# Patient Record
Sex: Male | Born: 1965 | Hispanic: No | Smoking: Former smoker
Health system: Southern US, Community
[De-identification: ages and names within clinical notes are randomized; demographics above are authoritative.]

## PROBLEM LIST (undated history)

## (undated) DIAGNOSIS — F419 Anxiety disorder, unspecified: Secondary | ICD-10-CM

## (undated) HISTORY — PX: OTHER SURGICAL HISTORY: SHX169

---

## 2012-07-10 ENCOUNTER — Encounter (HOSPITAL_COMMUNITY): Payer: Self-pay | Admitting: *Deleted

## 2012-07-10 ENCOUNTER — Emergency Department (INDEPENDENT_AMBULATORY_CARE_PROVIDER_SITE_OTHER)
Admission: EM | Admit: 2012-07-10 | Discharge: 2012-07-10 | Disposition: A | Discharge status: Home / Self Care | Payer: Self-pay | Source: Home / Self Care

## 2012-07-10 DIAGNOSIS — F419 Anxiety disorder, unspecified: Secondary | ICD-10-CM

## 2012-07-10 DIAGNOSIS — F411 Generalized anxiety disorder: Secondary | ICD-10-CM

## 2012-07-10 MED ORDER — LORAZEPAM 1 MG PO TABS: 1.0000 mg | ORAL_TABLET | Freq: Three times a day (TID) | ORAL | Status: DC
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
## 2015-03-20 ENCOUNTER — Emergency Department (HOSPITAL_COMMUNITY): Payer: 59

## 2015-03-20 ENCOUNTER — Emergency Department (HOSPITAL_COMMUNITY)
Admission: EM | Admit: 2015-03-20 | Discharge: 2015-03-20 | Disposition: A | Discharge status: Home / Self Care | Payer: 59 | Attending: Emergency Medicine | Admitting: Emergency Medicine

## 2015-03-20 ENCOUNTER — Encounter (HOSPITAL_COMMUNITY): Payer: Self-pay | Admitting: Emergency Medicine

## 2015-03-20 DIAGNOSIS — R197 Diarrhea, unspecified: Secondary | ICD-10-CM | POA: Insufficient documentation

## 2015-03-20 DIAGNOSIS — Z7982 Long term (current) use of aspirin: Secondary | ICD-10-CM | POA: Diagnosis not present

## 2015-03-20 DIAGNOSIS — R6883 Chills (without fever): Secondary | ICD-10-CM | POA: Diagnosis not present

## 2015-03-20 DIAGNOSIS — R1011 Right upper quadrant pain: Secondary | ICD-10-CM

## 2015-03-20 DIAGNOSIS — Z79899 Other long term (current) drug therapy: Secondary | ICD-10-CM | POA: Insufficient documentation

## 2015-03-20 DIAGNOSIS — K8050 Calculus of bile duct without cholangitis or cholecystitis without obstruction: Secondary | ICD-10-CM | POA: Diagnosis not present

## 2015-03-20 DIAGNOSIS — R630 Anorexia: Secondary | ICD-10-CM | POA: Insufficient documentation

## 2015-03-20 DIAGNOSIS — Z720 Tobacco use: Secondary | ICD-10-CM | POA: Insufficient documentation

## 2015-03-20 HISTORY — DX: Anxiety disorder, unspecified: F41.9

## 2015-03-20 LAB — COMPREHENSIVE METABOLIC PANEL
ALBUMIN: 4.4 g/dL (ref 3.5–5.0)
ALK PHOS: 61 U/L (ref 38–126)
ALT: 33 U/L (ref 17–63)
ANION GAP: 12 (ref 5–15)
AST: 28 U/L (ref 15–41)
BILIRUBIN TOTAL: 0.6 mg/dL (ref 0.3–1.2)
BUN: 12 mg/dL (ref 6–20)
CALCIUM: 9.2 mg/dL (ref 8.9–10.3)
CO2: 22 mmol/L (ref 22–32)
CREATININE: 1.17 mg/dL (ref 0.61–1.24)
Chloride: 103 mmol/L (ref 101–111)
GFR calc Af Amer: >60 mL/min (ref >60)
GLUCOSE: 147 mg/dL — AB (ref 65–99)
Potassium: 3.9 mmol/L (ref 3.5–5.1)
SODIUM: 137 mmol/L (ref 135–145)
TOTAL PROTEIN: 7.9 g/dL (ref 6.5–8.1)

## 2015-03-20 LAB — CBC WITH DIFFERENTIAL/PLATELET
Basophils Absolute: 0 10*3/uL (ref 0.0–0.1)
Basophils Relative: 0 % (ref 0–1)
Eosinophils Absolute: 0 10*3/uL (ref 0.0–0.7)
Eosinophils Relative: 0 % (ref 0–5)
HEMATOCRIT: 42.4 % (ref 39.0–52.0)
Hemoglobin: 14.7 g/dL (ref 13.0–17.0)
LYMPHS ABS: 1.6 10*3/uL (ref 0.7–4.0)
LYMPHS PCT: 10 % — AB (ref 12–46)
MCH: 31.5 pg (ref 26.0–34.0)
MCHC: 34.7 g/dL (ref 30.0–36.0)
MCV: 90.8 fL (ref 78.0–100.0)
MONOS PCT: 6 % (ref 3–12)
Monocytes Absolute: 1 10*3/uL (ref 0.1–1.0)
Neutro Abs: 13.8 10*3/uL — ABNORMAL HIGH (ref 1.7–7.7)
Neutrophils Relative %: 84 % — ABNORMAL HIGH (ref 43–77)
PLATELETS: 246 10*3/uL (ref 150–400)
RBC: 4.67 MIL/uL (ref 4.22–5.81)
RDW: 13.2 % (ref 11.5–15.5)
WBC: 16.4 10*3/uL — ABNORMAL HIGH (ref 4.0–10.5)

## 2015-03-20 LAB — URINALYSIS, ROUTINE W REFLEX MICROSCOPIC
BILIRUBIN URINE: NEGATIVE
GLUCOSE, UA: NEGATIVE mg/dL
Hgb urine dipstick: NEGATIVE
Ketones, ur: 15 mg/dL — AB
Nitrite: NEGATIVE
Protein, ur: NEGATIVE mg/dL
Specific Gravity, Urine: 1.03 (ref 1.005–1.030)
Urobilinogen, UA: 0.2 mg/dL (ref 0.0–1.0)
pH: 6.5 (ref 5.0–8.0)

## 2015-03-20 LAB — URINE MICROSCOPIC-ADD ON

## 2015-03-20 LAB — LIPASE, BLOOD: LIPASE: 20 U/L — AB (ref 22–51)

## 2015-03-20 MED ORDER — OXYCODONE-ACETAMINOPHEN 5-325 MG PO TABS: 1.0000 | ORAL_TABLET | Freq: Four times a day (QID) | ORAL | Status: DC | PRN

## 2015-03-20 MED ORDER — ONDANSETRON HCL 4 MG/2ML IJ SOLN
4.0000 mg | Freq: Once | INTRAMUSCULAR | Status: AC
  Administered 2015-03-20: 4 mg via INTRAVENOUS
  Filled 2015-03-20: qty 2

## 2015-03-20 MED ORDER — GI COCKTAIL ~~LOC~~
30.0000 mL | Freq: Once | ORAL | Status: AC
  Administered 2015-03-20: 30 mL via ORAL
  Filled 2015-03-20: qty 30

## 2015-03-20 MED ORDER — FENTANYL CITRATE (PF) 100 MCG/2ML IJ SOLN
50.0000 ug | Freq: Once | INTRAMUSCULAR | Status: AC
  Administered 2015-03-20: 50 ug via INTRAVENOUS
  Filled 2015-03-20: qty 2

## 2015-03-20 MED ORDER — ONDANSETRON HCL 4 MG PO TABS: 4.0000 mg | ORAL_TABLET | Freq: Four times a day (QID) | ORAL | Status: DC | PRN

## 2015-03-20 MED ORDER — OXYCODONE-ACETAMINOPHEN 5-325 MG PO TABS
2.0000 | ORAL_TABLET | Freq: Once | ORAL | Status: AC
  Administered 2015-03-20: 2 via ORAL
  Filled 2015-03-20: qty 2

## 2015-03-20 NOTE — ED Provider Notes (Signed)
CSN: [id number]     Arrival date & time 03/20/15  0534 History   First MD Initiated Contact with Patient 03/20/15 0700     Chief Complaint  Patient presents with  . Abdominal Pain     (Consider location/radiation/quality/duration/timing/severity/associated sxs/prior Treatment) Patient is a 49 y.o. male presenting with abdominal pain. The history is provided by the patient. No language interpreter was used.  Abdominal Pain Pain location:  Epigastric and RUQ Pain quality: aching and sharp   Pain radiates to:  R flank Pain severity:  Severe Onset quality:  Gradual Duration:  2 days Timing:  Constant Progression:  Worsening Chronicity:  New Relieved by:  Nothing Worsened by:  Eating Ineffective treatments:  None tried Associated symptoms: anorexia, chills, diarrhea, nausea and vomiting   Associated symptoms: no chest pain, no constipation, no cough, no dysuria, no fatigue, no fever and no shortness of breath     Past Medical History  Diagnosis Date  . Anxiety    Past Surgical History  Procedure Laterality Date  . Torn meniscus     Family History  Problem Relation Age of Onset  . Family history unknown: Yes   History  Substance Use Topics  . Smoking status: Current Every Day Smoker -- 0.00 packs/day    Types: Cigarettes  . Smokeless tobacco: Not on file  . Alcohol Use: Yes     Comment: social    Review of Systems  Constitutional: Positive for chills. Negative for fever, activity change, appetite change and fatigue.  HENT: Negative for congestion, facial swelling, rhinorrhea and trouble swallowing.   Eyes: Negative for photophobia and pain.  Respiratory: Negative for cough, chest tightness and shortness of breath.   Cardiovascular: Negative for chest pain and leg swelling.  Gastrointestinal: Positive for nausea, vomiting, abdominal pain, diarrhea and anorexia. Negative for constipation.  Endocrine: Negative for polydipsia and polyuria.  Genitourinary: Negative for  dysuria, urgency, decreased urine volume and difficulty urinating.  Musculoskeletal: Negative for back pain and gait problem.  Skin: Negative for color change, rash and wound.  Allergic/Immunologic: Negative for immunocompromised state.  Neurological: Negative for dizziness, facial asymmetry, speech difficulty, weakness, numbness and headaches.  Psychiatric/Behavioral: Negative for confusion, decreased concentration and agitation.      Allergies  Review of patient's allergies indicates no known allergies.  Home Medications   Prior to Admission medications   Medication Sig Start Date End Date Taking? Authorizing Provider  Aspirin-Acetaminophen (GOODYS BODY PAIN PO) Take 2 packets by mouth 3 (three) times daily as needed.   Yes Historical Provider, MD  IBUPROFEN PO Take 800 mg by mouth 3 (three) times daily as needed (as needed for headache or pain).   Yes Historical Provider, MD  Multiple Vitamins-Minerals (MULTIVITAMIN ADULT PO) Take 1 packet by mouth daily.   Yes Historical Provider, MD  LORazepam (ATIVAN) 1 MG tablet Take 1 tablet (1 mg total) by mouth every 8 (eight) hours. 07/10/12   Srikar Cherlynn Kaiser, MD  ondansetron (ZOFRAN) 4 MG tablet Take 1 tablet (4 mg total) by mouth every 6 (six) hours as needed for nausea or vomiting. 03/20/15   Toy Cookey, MD  oxyCODONE-acetaminophen (PERCOCET) 5-325 MG per tablet Take 1-2 tablets by mouth every 6 (six) hours as needed. 03/20/15   Toy Cookey, MD   BP 124/76 mmHg  Pulse 64  Temp(Src) 98.3 F (36.8 C) (Oral)  Resp 18  SpO2 95% Physical Exam  Constitutional: He is oriented to person, place, and time. He appears well-developed and well-nourished. No  distress.  HENT:  Head: Normocephalic and atraumatic.  Mouth/Throat: No oropharyngeal exudate.  Eyes: Pupils are equal, round, and reactive to light.  Neck: Normal range of motion. Neck supple.  Cardiovascular: Normal rate, regular rhythm and normal heart sounds.  Exam reveals no gallop and  no friction rub.   No murmur heard. Pulmonary/Chest: Effort normal and breath sounds normal. No respiratory distress. He has no wheezes. He has no rales.  Abdominal: Soft. Bowel sounds are normal. He exhibits no distension and no mass. There is tenderness in the right upper quadrant and epigastric area. There is no rigidity, no rebound, no guarding and negative Murphy's sign.  Musculoskeletal: Normal range of motion. He exhibits no edema or tenderness.  Neurological: He is alert and oriented to person, place, and time.  Skin: Skin is warm and dry.  Psychiatric: He has a normal mood and affect.    ED Course  Procedures (including critical care time) Labs Review Labs Reviewed  COMPREHENSIVE METABOLIC PANEL - Abnormal; Notable for the following:    Glucose, Bld 147 (*)    All other components within normal limits  LIPASE, BLOOD - Abnormal; Notable for the following:    Lipase 20 (*)    All other components within normal limits  CBC WITH DIFFERENTIAL/PLATELET - Abnormal; Notable for the following:    WBC 16.4 (*)    Neutrophils Relative % 84 (*)    Neutro Abs 13.8 (*)    Lymphocytes Relative 10 (*)    All other components within normal limits  URINALYSIS, ROUTINE W REFLEX MICROSCOPIC (NOT AT [facility]) - Abnormal; Notable for the following:    Color, Urine AMBER (*)    Ketones, ur 15 (*)    Leukocytes, UA TRACE (*)    All other components within normal limits  URINE MICROSCOPIC-ADD ON    Imaging Review US Abdomen Limited  03/20/2015   CLINICAL DATA:  One day history of right upper quadrant pain with nausea and vomiting  EXAM: US ABDOMEN LIMITED - RIGHT UPPER QUADRANT  COMPARISON:  None.  FINDINGS: Gallbladder:  Within the gallbladder, there is a 1.0 cm echogenic focus which shadows but does not move. There is no gallbladder wall thickening or pericholecystic fluid. No sonographic Murphy sign noted.  Common bile duct:  Diameter: 3 mm. No intrahepatic or extrahepatic biliary duct dilatation.   Liver:  No focal lesion identified. Within normal limits in parenchymal echogenicity.  IMPRESSION: 1 cm echogenic focus which shadows but does not move. Suspect adherent gallstone given the shadowing. Study otherwise unremarkable.   Electronically Signed   By: Bretta Bang III M.D.   On: 03/20/2015 09:56     EKG Interpretation None      MDM   Final diagnoses:  RUQ pain  Biliary colic    Pt is a 49 y.o. male with Pmhx as above who presents with 2 days of gradual onset worsening epigastric, RUQ pain, with assoc anorexia, n/v, chills. Pain worse after eating fried fish. No fever. + RUQ pain w/o rebound or guarding. CBC with leukocytosis, CMP, lipase nml. RUQ [place] ordered, was positve for single nonmobile stone w/o signs of cholecystitis. Pain improved with narcotic treatment, was tolering fluids. Will d/c home with percocet, zofran, dietary changes and instructions for outpatient follow up with gen surgery. Pt & wife have asked if they could just have GB taken out today, I do not feel there are indications for such as symptoms controlled and no signs of cholecystitis. Marland Kitchen  Earnstine Regalodney Casciano evaluation in the Emergency Department is complete. It has been determined that no acute conditions requiring further emergency intervention are present at this time. The patient/guardian have been advised of the diagnosis and plan. We have discussed signs and symptoms that warrant return to the ED, such as changes or worsening in symptoms, worsening or unresolved pain w/ treatment, fever, inability to tolerate liquids.       Toy CookeyMegan Docherty, MD 03/20/15 1135

## 2015-03-20 NOTE — Discharge Instructions (Signed)
Biliary Colic  °Biliary colic is a steady or irregular pain in the upper abdomen. It is usually under the right side of the rib cage. It happens when gallstones interfere with the normal flow of bile from the gallbladder. Bile is a liquid that helps to digest fats. Bile is made in the liver and stored in the gallbladder. When you eat a meal, bile passes from the gallbladder through the cystic duct and the common bile duct into the small intestine. There, it mixes with partially digested food. If a gallstone blocks either of these ducts, the normal flow of bile is blocked. The muscle cells in the bile duct contract forcefully to try to move the stone. This causes the pain of biliary colic.  °SYMPTOMS  °· A person with biliary colic usually complains of pain in the upper abdomen. This pain can be: °¨ In the center of the upper abdomen just below the breastbone. °¨ In the upper-right part of the abdomen, near the gallbladder and liver. °¨ Spread back toward the right shoulder blade. °· Nausea and vomiting. °· The pain usually occurs after eating. °· Biliary colic is usually triggered by the digestive system's demand for bile. The demand for bile is high after fatty meals. Symptoms can also occur when a person who has been fasting suddenly eats a very large meal. Most episodes of biliary colic pass after 1 to 5 hours. After the most intense pain passes, your abdomen may continue to ache mildly for about 24 hours. °DIAGNOSIS  °After you describe your symptoms, your caregiver will perform a physical exam. He or she will pay attention to the upper right portion of your belly (abdomen). This is the area of your liver and gallbladder. An ultrasound will help your caregiver look for gallstones. Specialized scans of the gallbladder may also be done. Blood tests may be done, especially if you have fever or if your pain persists. °PREVENTION  °Biliary colic can be prevented by controlling the risk factors for gallstones. Some of  these risk factors, such as heredity, increasing age, and pregnancy are a normal part of life. Obesity and a high-fat diet are risk factors you can change through a healthy lifestyle. Women going through menopause who take hormone replacement therapy (estrogen) are also more likely to develop biliary colic. °TREATMENT  °· Pain medication may be prescribed. °· You may be encouraged to eat a fat-free diet. °· If the first episode of biliary colic is severe, or episodes of colic keep retuning, surgery to remove the gallbladder (cholecystectomy) is usually recommended. This procedure can be done through small incisions using an instrument called a laparoscope. The procedure often requires a brief stay in the hospital. Some people can leave the hospital the same day. It is the most widely used treatment in people troubled by painful gallstones. It is effective and safe, with no complications in more than 90% of cases. °· If surgery cannot be done, medication that dissolves gallstones may be used. This medication is expensive and can take months or years to work. Only small stones will dissolve. °· Rarely, medication to dissolve gallstones is combined with a procedure called shock-wave lithotripsy. This procedure uses carefully aimed shock waves to break up gallstones. In many people treated with this procedure, gallstones form again within a few years. °PROGNOSIS  °If gallstones block your cystic duct or common bile duct, you are at risk for repeated episodes of biliary colic. There is also a 25% chance that you will develop   a gallbladder infection(acute cholecystitis), or some other complication of gallstones within 10 to 20 years. If you have surgery, schedule it at a time that is convenient for you and at a time when you are not sick. °HOME CARE INSTRUCTIONS  °· Drink plenty of clear fluids. °· Avoid fatty, greasy or fried foods, or any foods that make your pain worse. °· Take medications as directed. °SEEK MEDICAL  CARE IF:  °· You develop a fever over 100.5° F (38.1° C). °· Your pain gets worse over time. °· You develop nausea that prevents you from eating and drinking. °· You develop vomiting. °SEEK IMMEDIATE MEDICAL CARE IF:  °· You have continuous or severe belly (abdominal) pain which is not relieved with medications. °· You develop nausea and vomiting which is not relieved with medications. °· You have symptoms of biliary colic and you suddenly develop a fever and shaking chills. This may signal cholecystitis. Call your caregiver immediately. °· You develop a yellow color to your skin or the white part of your eyes (jaundice). °Document Released: 03/06/2006 Document Revised: 12/26/2011 Document Reviewed: 05/15/2008 °ExitCare® Patient Information ©2015 [employer]. This information is not intended to replace advice given to you by your health care provider. Make sure you discuss any questions you have with your health care provider. ° °

## 2015-03-20 NOTE — ED Notes (Signed)
Pt c/o headache -- worse than abd pain. abd pain is 5/10-

## 2015-03-20 NOTE — ED Notes (Signed)
Pt to US via stretcher

## 2015-03-20 NOTE — ED Notes (Signed)
Pt is in stable condition upon d/c and ambulates from ED. 

## 2015-03-20 NOTE — ED Notes (Signed)
Pt. reports mid/RUQ pain onset yesterday with emesis and diarrhea. , denies fever or chills. No urinary discomfort.

## 2015-03-30 ENCOUNTER — Other Ambulatory Visit: Payer: Self-pay | Admitting: General Surgery

## 2015-04-03 ENCOUNTER — Inpatient Hospital Stay (HOSPITAL_COMMUNITY)
Admission: EM | Admit: 2015-04-03 | Discharge: 2015-04-05 | DRG: 419 | Disposition: A | Discharge status: Home / Self Care | Payer: 59 | Attending: General Surgery | Admitting: General Surgery

## 2015-04-03 ENCOUNTER — Emergency Department (HOSPITAL_COMMUNITY): Payer: 59

## 2015-04-03 DIAGNOSIS — R1011 Right upper quadrant pain: Secondary | ICD-10-CM | POA: Diagnosis present

## 2015-04-03 DIAGNOSIS — K8020 Calculus of gallbladder without cholecystitis without obstruction: Secondary | ICD-10-CM

## 2015-04-03 DIAGNOSIS — K819 Cholecystitis, unspecified: Secondary | ICD-10-CM

## 2015-04-03 DIAGNOSIS — K8010 Calculus of gallbladder with chronic cholecystitis without obstruction: Secondary | ICD-10-CM | POA: Diagnosis present

## 2015-04-03 DIAGNOSIS — R52 Pain, unspecified: Secondary | ICD-10-CM

## 2015-04-03 DIAGNOSIS — K8000 Calculus of gallbladder with acute cholecystitis without obstruction: Secondary | ICD-10-CM | POA: Diagnosis present

## 2015-04-03 DIAGNOSIS — F17210 Nicotine dependence, cigarettes, uncomplicated: Secondary | ICD-10-CM | POA: Diagnosis present

## 2015-04-03 LAB — COMPREHENSIVE METABOLIC PANEL
ALK PHOS: 56 U/L (ref 38–126)
ALT: 26 U/L (ref 17–63)
AST: 23 U/L (ref 15–41)
Albumin: 3.6 g/dL (ref 3.5–5.0)
Anion gap: 7 (ref 5–15)
BILIRUBIN TOTAL: 0.7 mg/dL (ref 0.3–1.2)
BUN: 13 mg/dL (ref 6–20)
CHLORIDE: 106 mmol/L (ref 101–111)
CO2: 25 mmol/L (ref 22–32)
CREATININE: 0.99 mg/dL (ref 0.61–1.24)
Calcium: 9.1 mg/dL (ref 8.9–10.3)
GFR calc Af Amer: >60 mL/min (ref >60)
GFR calc non Af Amer: >60 mL/min (ref >60)
Glucose, Bld: 100 mg/dL — ABNORMAL HIGH (ref 65–99)
Potassium: 3.7 mmol/L (ref 3.5–5.1)
Sodium: 138 mmol/L (ref 135–145)
Total Protein: 6.4 g/dL — ABNORMAL LOW (ref 6.5–8.1)

## 2015-04-03 LAB — CBC
HCT: 40.1 % (ref 39.0–52.0)
HEMOGLOBIN: 13.6 g/dL (ref 13.0–17.0)
MCH: 31.5 pg (ref 26.0–34.0)
MCHC: 33.9 g/dL (ref 30.0–36.0)
MCV: 92.8 fL (ref 78.0–100.0)
Platelets: 248 10*3/uL (ref 150–400)
RBC: 4.32 MIL/uL (ref 4.22–5.81)
RDW: 12.9 % (ref 11.5–15.5)
WBC: 7 10*3/uL (ref 4.0–10.5)

## 2015-04-03 LAB — CBC WITH DIFFERENTIAL/PLATELET
BASOS ABS: 0 10*3/uL (ref 0.0–0.1)
Basophils Relative: 1 % (ref 0–1)
Eosinophils Absolute: 0.3 10*3/uL (ref 0.0–0.7)
Eosinophils Relative: 5 % (ref 0–5)
HEMATOCRIT: 42.2 % (ref 39.0–52.0)
Hemoglobin: 14.5 g/dL (ref 13.0–17.0)
LYMPHS PCT: 46 % (ref 12–46)
Lymphs Abs: 3.3 10*3/uL (ref 0.7–4.0)
MCH: 31.6 pg (ref 26.0–34.0)
MCHC: 34.4 g/dL (ref 30.0–36.0)
MCV: 91.9 fL (ref 78.0–100.0)
Monocytes Absolute: 0.7 10*3/uL (ref 0.1–1.0)
Monocytes Relative: 9 % (ref 3–12)
NEUTROS ABS: 2.7 10*3/uL (ref 1.7–7.7)
NEUTROS PCT: 39 % — AB (ref 43–77)
Platelets: 263 10*3/uL (ref 150–400)
RBC: 4.59 MIL/uL (ref 4.22–5.81)
RDW: 13 % (ref 11.5–15.5)
WBC: 7 10*3/uL (ref 4.0–10.5)

## 2015-04-03 LAB — CREATININE, SERUM
Creatinine, Ser: 1.07 mg/dL (ref 0.61–1.24)
GFR calc Af Amer: >60 mL/min (ref >60)
GFR calc non Af Amer: >60 mL/min (ref >60)

## 2015-04-03 LAB — LIPASE, BLOOD: Lipase: 26 U/L (ref 22–51)

## 2015-04-03 MED ORDER — ONDANSETRON HCL 4 MG/2ML IJ SOLN: 4.0000 mg | Freq: Four times a day (QID) | INTRAMUSCULAR | Status: DC | PRN

## 2015-04-03 MED ORDER — PANTOPRAZOLE SODIUM 40 MG IV SOLR
40.0000 mg | Freq: Every day | INTRAVENOUS | Status: DC
  Administered 2015-04-03 – 2015-04-04 (×2): 40 mg via INTRAVENOUS
  Filled 2015-04-03 (×2): qty 40

## 2015-04-03 MED ORDER — SODIUM CHLORIDE 0.9 % IV BOLUS (SEPSIS)
1000.0000 mL | Freq: Once | INTRAVENOUS | Status: AC
  Administered 2015-04-03: 1000 mL via INTRAVENOUS

## 2015-04-03 MED ORDER — DEXTROSE 5 % IV SOLN
2.0000 g | INTRAVENOUS | Status: DC
  Administered 2015-04-03 – 2015-04-04 (×2): 2 g via INTRAVENOUS
  Filled 2015-04-03 (×4): qty 2

## 2015-04-03 MED ORDER — HYDROMORPHONE HCL 1 MG/ML IJ SOLN
1.0000 mg | Freq: Once | INTRAMUSCULAR | Status: AC
  Administered 2015-04-03: 1 mg via INTRAVENOUS
  Filled 2015-04-03: qty 1

## 2015-04-03 MED ORDER — POTASSIUM CHLORIDE IN NACL 20-0.9 MEQ/L-% IV SOLN
INTRAVENOUS | Status: DC
  Administered 2015-04-03 – 2015-04-05 (×4): via INTRAVENOUS
  Filled 2015-04-03 (×6): qty 1000

## 2015-04-03 MED ORDER — ONDANSETRON HCL 4 MG/2ML IJ SOLN
4.0000 mg | Freq: Once | INTRAMUSCULAR | Status: AC
  Administered 2015-04-03: 4 mg via INTRAVENOUS
  Filled 2015-04-03: qty 2

## 2015-04-03 MED ORDER — HYDROMORPHONE HCL 1 MG/ML IJ SOLN
0.5000 mg | INTRAMUSCULAR | Status: DC | PRN
  Administered 2015-04-03 – 2015-04-05 (×14): 1 mg via INTRAVENOUS
  Filled 2015-04-03 (×14): qty 1

## 2015-04-03 MED ORDER — DIPHENHYDRAMINE HCL 50 MG/ML IJ SOLN
12.5000 mg | Freq: Four times a day (QID) | INTRAMUSCULAR | Status: DC | PRN
  Administered 2015-04-04: 12.5 mg via INTRAVENOUS
  Filled 2015-04-03 (×2): qty 1

## 2015-04-03 MED ORDER — HYDROMORPHONE HCL 1 MG/ML IJ SOLN
0.5000 mg | Freq: Once | INTRAMUSCULAR | Status: AC
  Administered 2015-04-03: 0.5 mg via INTRAVENOUS
  Filled 2015-04-03: qty 1

## 2015-04-03 MED ORDER — DIPHENHYDRAMINE HCL 12.5 MG/5ML PO ELIX: 12.5000 mg | ORAL_SOLUTION | Freq: Four times a day (QID) | ORAL | Status: DC | PRN

## 2015-04-03 MED ORDER — HEPARIN SODIUM (PORCINE) 5000 UNIT/ML IJ SOLN
5000.0000 [IU] | Freq: Three times a day (TID) | INTRAMUSCULAR | Status: AC
  Administered 2015-04-03 (×2): 5000 [IU] via SUBCUTANEOUS
  Filled 2015-04-03 (×2): qty 1

## 2015-04-03 NOTE — H&P (Signed)
Jacob Sheppard is an 49 y.o. male.   Chief Complaint: abdominal pain HPI: Seen by Dr. Marlou Starks and scheduled for elective cholecystectomy on 6/27.  Pt having pain with PO's for a month, some diarrhea, headaches worse with pain. Pain usually doesn't last very long.  Last PM he started having pain about 10 PM, he works nights at a [employer] facility.  He came home early this AM, this is the worst the pain has ever been.  It has not gotten better, he is having nausea, vomited x 5 this AM. He tried an egg earlier and then vomited it up   GB, now with ongoing pain.  [place] on 03/20/15 showed:  Within the gallbladder, there is a 1.0 cm echogenic focus which shadows but does not move. There is no gallbladder wall thickening or pericholecystic fluid. No sonographic Murphy sign noted. We are ask to see he has pain that is only improved with pain meds.  Past Medical History  Diagnosis Date  . Anxiety Hx of tobacco use Hx of ETOH use for about 9 years     Past Surgical History  Procedure Laterality Date  . Torn meniscus      Family History  Problem Relation Age of Onset  . Family history unknown: Yes   Social History:  reports that he has been smoking Cigarettes.  He has been smoking about 0.00 packs per day. He does not have any smokeless tobacco history on file. He reports that he drinks alcohol. He reports that he does not use illicit drugs.    Tobacco 38 years ETOH heavy for 9 years, social last year Drugs: none   Allergies: No Known Allergies  Prior to Admission medications   Medication Sig Start Date End Date Taking? Authorizing Provider  IBUPROFEN PO Take 800 mg by mouth 3 (three) times daily as needed (as needed for headache or pain).   Yes Historical Provider, MD  Multiple Vitamins-Minerals (MULTIVITAMIN ADULT PO) Take 1 packet by mouth daily.   Yes Historical Provider, MD  ondansetron (ZOFRAN) 4 MG tablet Take 1 tablet (4 mg total) by mouth every 6 (six) hours as needed for nausea or  vomiting. 03/20/15  Yes Ernestina Patches, MD  LORazepam (ATIVAN) 1 MG tablet Take 1 tablet (1 mg total) by mouth every 8 (eight) hours. Patient not taking: Reported on 04/03/2015 07/10/12   Bynum Bellows, MD  oxyCODONE-acetaminophen (PERCOCET) 5-325 MG per tablet Take 1-2 tablets by mouth every 6 (six) hours as needed. Patient not taking: Reported on 04/03/2015 03/20/15   Ernestina Patches, MD     Results for orders placed or performed during the hospital encounter of 04/03/15 (from the past 48 hour(s))  CBC with Differential     Status: Abnormal   Collection Time: 04/03/15  9:50 AM  Result Value Ref Range   WBC 7.0 4.0 - 10.5 K/uL   RBC 4.59 4.22 - 5.81 MIL/uL   Hemoglobin 14.5 13.0 - 17.0 g/dL   HCT 42.2 39.0 - 52.0 %   MCV 91.9 78.0 - 100.0 fL   MCH 31.6 26.0 - 34.0 pg   MCHC 34.4 30.0 - 36.0 g/dL   RDW 13.0 11.5 - 15.5 %   Platelets 263 150 - 400 K/uL   Neutrophils Relative % 39 (L) 43 - 77 %   Neutro Abs 2.7 1.7 - 7.7 K/uL   Lymphocytes Relative 46 12 - 46 %   Lymphs Abs 3.3 0.7 - 4.0 K/uL   Monocytes Relative 9 3 -  12 %   Monocytes Absolute 0.7 0.1 - 1.0 K/uL   Eosinophils Relative 5 0 - 5 %   Eosinophils Absolute 0.3 0.0 - 0.7 K/uL   Basophils Relative 1 0 - 1 %   Basophils Absolute 0.0 0.0 - 0.1 K/uL  Comprehensive metabolic panel     Status: Abnormal   Collection Time: 04/03/15  9:50 AM  Result Value Ref Range   Sodium 138 135 - 145 mmol/L   Potassium 3.7 3.5 - 5.1 mmol/L   Chloride 106 101 - 111 mmol/L   CO2 25 22 - 32 mmol/L   Glucose, Bld 100 (H) 65 - 99 mg/dL   BUN 13 6 - 20 mg/dL   Creatinine, Ser 0.99 0.61 - 1.24 mg/dL   Calcium 9.1 8.9 - 10.3 mg/dL   Total Protein 6.4 (L) 6.5 - 8.1 g/dL   Albumin 3.6 3.5 - 5.0 g/dL   AST 23 15 - 41 U/L   ALT 26 17 - 63 U/L   Alkaline Phosphatase 56 38 - 126 U/L   Total Bilirubin 0.7 0.3 - 1.2 mg/dL   GFR calc non Af Amer >60 >60 mL/min   GFR calc Af Amer >60 >60 mL/min    Comment: (NOTE) The eGFR has been calculated using the  CKD EPI equation. This calculation has not been validated in all clinical situations. eGFR's persistently <60 mL/min signify possible Chronic Kidney Disease.    Anion gap 7 5 - 15  Lipase, blood     Status: None   Collection Time: 04/03/15  9:50 AM  Result Value Ref Range   Lipase 26 22 - 51 U/L   No results found.  Review of Systems  Constitutional: Positive for weight loss (2 pounds). Negative for fever.  Eyes: Positive for blurred vision.  Respiratory: Negative.   Cardiovascular: Negative.   Gastrointestinal: Positive for nausea, vomiting, abdominal pain and diarrhea.  Genitourinary: Negative.   Musculoskeletal: Negative.   Skin: Negative.   Neurological: Negative.   Endo/Heme/Allergies: Negative.   Psychiatric/Behavioral: Negative.     Blood pressure 130/74, pulse 57, temperature 97.5 F (36.4 C), temperature source Oral, resp. rate 18, SpO2 97 %. Physical Exam  Constitutional: He is oriented to person, place, and time. He appears well-developed and well-nourished. He appears distressed (he is having allot of pain right side).  HENT:  Head: Normocephalic and atraumatic.  Nose: Nose normal.  Eyes: Conjunctivae and EOM are normal. Right eye exhibits no discharge. Left eye exhibits no discharge. Scleral icterus is present.  Neck: Normal range of motion. Neck supple. No JVD present. No tracheal deviation present. No thyromegaly present.  Cardiovascular: Normal rate, regular rhythm, normal heart sounds and intact distal pulses.   No murmur heard. Respiratory: Effort normal and breath sounds normal. No respiratory distress. He has no wheezes. He has no rales. He exhibits no tenderness.  GI: Soft. Bowel sounds are normal. He exhibits no distension and no mass. There is tenderness (Right side, mostly RUQ). There is no rebound and no guarding.  Musculoskeletal: He exhibits no edema.  Lymphadenopathy:    He has no cervical adenopathy.  Neurological: He is alert and oriented to  person, place, and time. No cranial nerve deficit.  Skin: Skin is warm and dry. No rash noted. No erythema. No pallor.  Psychiatric: He has a normal mood and affect. His behavior is normal. Judgment and thought content normal.     Assessment/Plan Acute cholecystitis with cholelithiasis Hx tobacco use  Hx of ETOH  Plan:  Admit, hydrate, pain control tonight, surgery tomorrow.  Annalie Wenner 04/03/2015, 11:29 AM

## 2015-04-03 NOTE — ED Provider Notes (Signed)
CSN: [id number]     Arrival date & time 04/03/15  4098 History   First MD Initiated Contact with Patient 04/03/15 0945     Chief Complaint  Patient presents with  . Abdominal Pain     (Consider location/radiation/quality/duration/timing/severity/associated sxs/prior Treatment) HPI  Blood pressure 128/77, pulse 68, temperature 97.5 F (36.4 C), temperature source Oral, resp. rate 18, SpO2 99 %.  Jacob Sheppard is a 49 y.o. male complaining of right upper quadrant abdominal pain rated 10 out of 10 with no exacerbating or alleviating factors identified associated with multiple episodes of nonbloody, nonbilious, non-coffee ground emesis and single episode of loose stools starting yesterday at 10 PM. Patient has known  cholelithiasis and is scheduled for cystectomy in 2 weeks by Dr. Carolynne Edouard, patient's wife called [facility] and was advised to come to the ED for evaluation of possible expedited surgery. Patient reports associated 7 out of 10 generalized headache.   Past Medical History  Diagnosis Date  . Anxiety    Past Surgical History  Procedure Laterality Date  . Torn meniscus     Family History  Problem Relation Age of Onset  . Family history unknown: Yes   History  Substance Use Topics  . Smoking status: Current Every Day Smoker -- 0.00 packs/day    Types: Cigarettes  . Smokeless tobacco: Not on file  . Alcohol Use: Yes     Comment: social    Review of Systems  10 systems reviewed and found to be negative, except as noted in the HPI.   Allergies  Review of patient's allergies indicates no known allergies.  Home Medications   Prior to Admission medications   Medication Sig Start Date End Date Taking? Authorizing Provider  Aspirin-Acetaminophen (GOODYS BODY PAIN PO) Take 2 packets by mouth 3 (three) times daily as needed.    Historical Provider, MD  IBUPROFEN PO Take 800 mg by mouth 3 (three) times daily as needed (as needed for headache or pain).    Historical Provider, MD   LORazepam (ATIVAN) 1 MG tablet Take 1 tablet (1 mg total) by mouth every 8 (eight) hours. 07/10/12   Cristal Ford, MD  Multiple Vitamins-Minerals (MULTIVITAMIN ADULT PO) Take 1 packet by mouth daily.    Historical Provider, MD  ondansetron (ZOFRAN) 4 MG tablet Take 1 tablet (4 mg total) by mouth every 6 (six) hours as needed for nausea or vomiting. 03/20/15   Toy Cookey, MD  oxyCODONE-acetaminophen (PERCOCET) 5-325 MG per tablet Take 1-2 tablets by mouth every 6 (six) hours as needed. 03/20/15   Toy Cookey, MD   BP 128/77 mmHg  Pulse 68  Temp(Src) 97.5 F (36.4 C) (Oral)  Resp 18  SpO2 99% Physical Exam  Constitutional: He is oriented to person, place, and time. He appears well-developed and well-nourished. No distress.  HENT:  Head: Normocephalic and atraumatic.  Mouth/Throat: Oropharynx is clear and moist.  Eyes: Conjunctivae and EOM are normal. Pupils are equal, round, and reactive to light.  No TTP of maxillary or frontal sinuses  No TTP or induration of temporal arteries bilaterally  Neck: Normal range of motion. Neck supple.  FROM to C-spine. Pt can touch chin to chest without discomfort. No TTP of midline cervical spine.   Cardiovascular: Normal rate, regular rhythm and intact distal pulses.   Pulmonary/Chest: Effort normal and breath sounds normal. No stridor. No respiratory distress. He has no wheezes. He has no rales. He exhibits no tenderness.  Abdominal: Soft. Bowel sounds are normal. He exhibits  no distension and no mass. There is tenderness. There is no rebound and no guarding.  Tender in the right upper quadrant with no guarding or rebound, hyperactive bowel sounds.  Musculoskeletal: Normal range of motion. He exhibits no edema or tenderness.  Neurological: He is alert and oriented to person, place, and time. No cranial nerve deficit.  Follows commands, Clear, goal oriented speech, Strength is 5 out of 5x4 extremities, patient ambulates with a coordinated in  nonantalgic gait. Sensation is grossly intact.    Psychiatric: He has a normal mood and affect.  Nursing note and vitals reviewed.   ED Course  Procedures (including critical care time) Labs Review  US Abdomen Limited  03/20/2015   CLINICAL DATA:  One day history of right upper quadrant pain with nausea and vomiting  EXAM: US ABDOMEN LIMITED - RIGHT UPPER QUADRANT  COMPARISON:  None.  FINDINGS: Gallbladder:  Within the gallbladder, there is a 1.0 cm echogenic focus which shadows but does not move. There is no gallbladder wall thickening or pericholecystic fluid. No sonographic Murphy sign noted.  Common bile duct:  Diameter: 3 mm. No intrahepatic or extrahepatic biliary duct dilatation.  Liver:  No focal lesion identified. Within normal limits in parenchymal echogenicity.  IMPRESSION: 1 cm echogenic focus which shadows but does not move. Suspect adherent gallstone given the shadowing. Study otherwise unremarkable.   Electronically Signed   By: Bretta Bang III M.D.   On: 03/20/2015 09:56   US Abdomen Limited Ruq  04/03/2015   CLINICAL DATA:  Pain and anxiety.  Abdominal pain.  EXAM: US ABDOMEN LIMITED - RIGHT UPPER QUADRANT  COMPARISON:  03/20/2015  FINDINGS: Gallbladder:  No gallstones or wall thickening visualized. Suspected adherent sludge along the inferior margin of the gallbladder, ill-defined. No sonographic Murphy sign noted.  Common bile duct:  Diameter: 5 mm  Liver:  No focal lesion identified. Within normal limits in parenchymal echogenicity.  IMPRESSION: 1. Small amount of adherent sludge along the inferior portion of the gallbladder. No shadowing to favor gallstone. Otherwise negative.   Electronically Signed   By: Gaylyn Rong M.D.   On: 04/03/2015 13:28     EKG Interpretation None      MDM   Final diagnoses:  Pain    Filed Vitals:   04/03/15 1245 04/03/15 1315 04/03/15 1330 04/03/15 1416  BP: 114/70 111/74 115/76 109/73  Pulse: 56 50 55 56  Temp:    98.2 F  (36.8 C)  TempSrc:    Oral  Resp:    18  SpO2: 97% 97% 95% 98%    Medications  heparin injection 5,000 Units (5,000 Units Subcutaneous Given 04/03/15 1342)  0.9 % NaCl with KCl 20 mEq/ L  infusion (not administered)  cefTRIAXone (ROCEPHIN) 2 g in dextrose 5 % 50 mL IVPB (2 g Intravenous New Bag/Given 04/03/15 1342)  HYDROmorphone (DILAUDID) injection 0.5-1 mg (1 mg Intravenous Given 04/03/15 1342)  diphenhydrAMINE (BENADRYL) injection 12.5 mg (not administered)    Or  diphenhydrAMINE (BENADRYL) 12.5 MG/5ML elixir 12.5 mg (not administered)  ondansetron (ZOFRAN) injection 4 mg (not administered)  pantoprazole (PROTONIX) injection 40 mg (not administered)  sodium chloride 0.9 % bolus 1,000 mL (0 mLs Intravenous Stopped 04/03/15 1212)  HYDROmorphone (DILAUDID) injection 0.5 mg (0.5 mg Intravenous Given 04/03/15 1023)  ondansetron (ZOFRAN) injection 4 mg (4 mg Intravenous Given 04/03/15 1023)  HYDROmorphone (DILAUDID) injection 1 mg (1 mg Intravenous Given 04/03/15 1115)    Jacob Sheppard is a pleasant 49 y.o. male presenting  with right upper quadrant abdominal pain, nausea vomiting and loose stool onset last night at 10 PM. Abdominal exam is nonsurgical. Patient advised to remain nothing by mouth, will recheck right upper quadrant ultrasound, basic blood work and call general surgery.  No leukocytosis or elevated bilirubin. Case discussed with the APP Marlyne Beards from general surgery he will evaluate the patient.  General surgery will admit the patient.   Wynetta Emery, PA-C 04/03/15 1530  Eber Hong, MD 04/04/15 [phone]

## 2015-04-03 NOTE — ED Provider Notes (Signed)
The patient is a 49 year old male, he has known gallbladder stone, had ultrasound done on June 3, showed a single non-mobile stone, since that time he has had intermittent upper abdominal pain with nausea, this started last night at 10:00, has been persistent throughout the evening, associated with a watery stool, on exam he has tenderness in the right upper quadrant, the right lateral side and the right CVA area. There is no other abdominal tenderness, clear heart and lung sounds, labs reviewed showing no leukocytosis or abnormal liver function, repeat ultrasound has been ordered, will discussed with general surgery as according to the pt, they suggested that he come to the emergency department.  Medical screening examination/treatment/procedure(s) were conducted as a shared visit with non-physician practitioner(s) and myself.  I personally evaluated the patient during the encounter.  Clinical Impression:   Final diagnoses:  Pain  Biliary Colic      Eber Hong, MD 04/04/15 [phone]

## 2015-04-03 NOTE — ED Notes (Signed)
Pt c/o upper right abdominal pain and right side pain radiating to back beginning last night and worsening through this morning. Pt reports multiple episodes of vomiting and constant nausea. Scheduled for gallbladder surgery in two weeks.

## 2015-04-03 NOTE — ED Notes (Signed)
Pt back from US at this time.

## 2015-04-03 NOTE — ED Notes (Signed)
Patient transported to Ultrasound 

## 2015-04-04 ENCOUNTER — Encounter (HOSPITAL_COMMUNITY): Admission: EM | Disposition: A | Discharge status: Home / Self Care | Payer: Self-pay | Source: Home / Self Care

## 2015-04-04 ENCOUNTER — Inpatient Hospital Stay (HOSPITAL_COMMUNITY): Payer: 59

## 2015-04-04 ENCOUNTER — Inpatient Hospital Stay (HOSPITAL_COMMUNITY): Payer: 59 | Admitting: Anesthesiology

## 2015-04-04 ENCOUNTER — Ambulatory Visit (HOSPITAL_COMMUNITY): Admission: RE | Admit: 2015-04-04 | Payer: 59 | Source: Ambulatory Visit | Admitting: General Surgery

## 2015-04-04 ENCOUNTER — Encounter (HOSPITAL_COMMUNITY): Payer: Self-pay | Admitting: *Deleted

## 2015-04-04 HISTORY — PX: CHOLECYSTECTOMY: SHX55

## 2015-04-04 LAB — COMPREHENSIVE METABOLIC PANEL
ALT: 24 U/L (ref 17–63)
ANION GAP: 7 (ref 5–15)
AST: 23 U/L (ref 15–41)
Albumin: 3.3 g/dL — ABNORMAL LOW (ref 3.5–5.0)
Alkaline Phosphatase: 48 U/L (ref 38–126)
BUN: 12 mg/dL (ref 6–20)
CO2: 28 mmol/L (ref 22–32)
CREATININE: 1.18 mg/dL (ref 0.61–1.24)
Calcium: 8.8 mg/dL — ABNORMAL LOW (ref 8.9–10.3)
Chloride: 105 mmol/L (ref 101–111)
GLUCOSE: 87 mg/dL (ref 65–99)
Potassium: 4.8 mmol/L (ref 3.5–5.1)
Sodium: 140 mmol/L (ref 135–145)
Total Bilirubin: 0.8 mg/dL (ref 0.3–1.2)
Total Protein: 6.1 g/dL — ABNORMAL LOW (ref 6.5–8.1)

## 2015-04-04 LAB — SURGICAL PCR SCREEN
MRSA, PCR: NEGATIVE
Staphylococcus aureus: NEGATIVE

## 2015-04-04 LAB — CBC
HEMATOCRIT: 40.8 % (ref 39.0–52.0)
HEMOGLOBIN: 13.7 g/dL (ref 13.0–17.0)
MCH: 31.1 pg (ref 26.0–34.0)
MCHC: 33.6 g/dL (ref 30.0–36.0)
MCV: 92.7 fL (ref 78.0–100.0)
Platelets: 265 10*3/uL (ref 150–400)
RBC: 4.4 MIL/uL (ref 4.22–5.81)
RDW: 13.2 % (ref 11.5–15.5)
WBC: 9.2 10*3/uL (ref 4.0–10.5)

## 2015-04-04 LAB — APTT: APTT: 33 s (ref 24–37)

## 2015-04-04 LAB — PROTIME-INR
INR: 1.04 (ref 0.00–1.49)
PROTHROMBIN TIME: 13.8 s (ref 11.6–15.2)

## 2015-04-04 SURGERY — LAPAROSCOPIC CHOLECYSTECTOMY WITH INTRAOPERATIVE CHOLANGIOGRAM
Anesthesia: General | Site: Abdomen

## 2015-04-04 MED ORDER — BUPIVACAINE-EPINEPHRINE (PF) 0.25% -1:200000 IJ SOLN
INTRAMUSCULAR | Status: AC
  Filled 2015-04-04: qty 30

## 2015-04-04 MED ORDER — 0.9 % SODIUM CHLORIDE (POUR BTL) OPTIME
TOPICAL | Status: DC | PRN
  Administered 2015-04-04: 1000 mL

## 2015-04-04 MED ORDER — DEXAMETHASONE SODIUM PHOSPHATE 4 MG/ML IJ SOLN
INTRAMUSCULAR | Status: DC | PRN
  Administered 2015-04-04: 8 mg via INTRAVENOUS

## 2015-04-04 MED ORDER — SCOPOLAMINE 1 MG/3DAYS TD PT72
MEDICATED_PATCH | TRANSDERMAL | Status: DC | PRN
  Administered 2015-04-04: 1 via TRANSDERMAL

## 2015-04-04 MED ORDER — SODIUM CHLORIDE 0.9 % IV SOLN
INTRAVENOUS | Status: DC | PRN
  Administered 2015-04-04: 10 mL

## 2015-04-04 MED ORDER — DEXAMETHASONE SODIUM PHOSPHATE 4 MG/ML IJ SOLN
INTRAMUSCULAR | Status: AC
  Filled 2015-04-04: qty 2

## 2015-04-04 MED ORDER — ROCURONIUM BROMIDE 50 MG/5ML IV SOLN
INTRAVENOUS | Status: AC
  Filled 2015-04-04: qty 1

## 2015-04-04 MED ORDER — LACTATED RINGERS IV SOLN
INTRAVENOUS | Status: DC | PRN
  Administered 2015-04-04: 11:00:00 via INTRAVENOUS

## 2015-04-04 MED ORDER — OXYCODONE HCL 5 MG PO TABS
5.0000 mg | ORAL_TABLET | ORAL | Status: DC | PRN
  Administered 2015-04-05 (×3): 10 mg via ORAL
  Filled 2015-04-04 (×3): qty 2

## 2015-04-04 MED ORDER — PROPOFOL 10 MG/ML IV BOLUS
INTRAVENOUS | Status: DC | PRN
  Administered 2015-04-04: 200 mg via INTRAVENOUS

## 2015-04-04 MED ORDER — SODIUM CHLORIDE 0.9 % IV SOLN
INTRAVENOUS | Status: DC | PRN
  Administered 2015-04-04: 11:00:00 via INTRAVENOUS

## 2015-04-04 MED ORDER — HYDROMORPHONE HCL 1 MG/ML IJ SOLN
INTRAMUSCULAR | Status: AC
  Filled 2015-04-04: qty 1

## 2015-04-04 MED ORDER — GLYCOPYRROLATE 0.2 MG/ML IJ SOLN
INTRAMUSCULAR | Status: AC
  Filled 2015-04-04: qty 2

## 2015-04-04 MED ORDER — NEOSTIGMINE METHYLSULFATE 10 MG/10ML IV SOLN
INTRAVENOUS | Status: DC | PRN
  Administered 2015-04-04: 2 mg via INTRAVENOUS

## 2015-04-04 MED ORDER — PROPOFOL 10 MG/ML IV BOLUS
INTRAVENOUS | Status: AC
  Filled 2015-04-04: qty 20

## 2015-04-04 MED ORDER — GLYCOPYRROLATE 0.2 MG/ML IJ SOLN
INTRAMUSCULAR | Status: DC | PRN
  Administered 2015-04-04: 0.4 mg via INTRAVENOUS

## 2015-04-04 MED ORDER — ROCURONIUM BROMIDE 100 MG/10ML IV SOLN
INTRAVENOUS | Status: DC | PRN
  Administered 2015-04-04: 40 mg via INTRAVENOUS

## 2015-04-04 MED ORDER — LIDOCAINE HCL (CARDIAC) 20 MG/ML IV SOLN
INTRAVENOUS | Status: DC | PRN
  Administered 2015-04-04: 60 mg via INTRAVENOUS

## 2015-04-04 MED ORDER — SODIUM CHLORIDE 0.9 % IR SOLN
Status: DC | PRN
  Administered 2015-04-04: 1000 mL

## 2015-04-04 MED ORDER — LIDOCAINE HCL (CARDIAC) 20 MG/ML IV SOLN
INTRAVENOUS | Status: AC
  Filled 2015-04-04: qty 5

## 2015-04-04 MED ORDER — OXYCODONE HCL 5 MG/5ML PO SOLN: 5.0000 mg | Freq: Once | ORAL | Status: DC | PRN

## 2015-04-04 MED ORDER — BUPIVACAINE-EPINEPHRINE 0.25% -1:200000 IJ SOLN
INTRAMUSCULAR | Status: DC | PRN
  Administered 2015-04-04: 11 mL

## 2015-04-04 MED ORDER — FENTANYL CITRATE (PF) 250 MCG/5ML IJ SOLN
INTRAMUSCULAR | Status: AC
  Filled 2015-04-04: qty 5

## 2015-04-04 MED ORDER — MIDAZOLAM HCL 2 MG/2ML IJ SOLN
INTRAMUSCULAR | Status: DC | PRN
  Administered 2015-04-04: 2 mg via INTRAVENOUS

## 2015-04-04 MED ORDER — FENTANYL CITRATE (PF) 250 MCG/5ML IJ SOLN
INTRAMUSCULAR | Status: DC | PRN
  Administered 2015-04-04: 100 ug via INTRAVENOUS
  Administered 2015-04-04: 150 ug via INTRAVENOUS

## 2015-04-04 MED ORDER — MEPERIDINE HCL 25 MG/ML IJ SOLN: 6.2500 mg | INTRAMUSCULAR | Status: DC | PRN

## 2015-04-04 MED ORDER — OXYCODONE HCL 5 MG PO TABS: 5.0000 mg | ORAL_TABLET | Freq: Once | ORAL | Status: DC | PRN

## 2015-04-04 MED ORDER — ONDANSETRON HCL 4 MG/2ML IJ SOLN
INTRAMUSCULAR | Status: DC | PRN
  Administered 2015-04-04: 4 mg via INTRAVENOUS

## 2015-04-04 MED ORDER — SCOPOLAMINE 1 MG/3DAYS TD PT72
MEDICATED_PATCH | TRANSDERMAL | Status: AC
  Filled 2015-04-04: qty 1

## 2015-04-04 MED ORDER — NEOSTIGMINE METHYLSULFATE 10 MG/10ML IV SOLN
INTRAVENOUS | Status: AC
  Filled 2015-04-04: qty 1

## 2015-04-04 MED ORDER — ONDANSETRON HCL 4 MG/2ML IJ SOLN
INTRAMUSCULAR | Status: AC
  Filled 2015-04-04: qty 2

## 2015-04-04 MED ORDER — HYDROMORPHONE HCL 1 MG/ML IJ SOLN
0.2500 mg | INTRAMUSCULAR | Status: DC | PRN
  Administered 2015-04-04 (×4): 0.5 mg via INTRAVENOUS

## 2015-04-04 MED ORDER — MIDAZOLAM HCL 2 MG/2ML IJ SOLN
INTRAMUSCULAR | Status: AC
  Filled 2015-04-04: qty 2

## 2015-04-04 SURGICAL SUPPLY — 39 items
APPLIER CLIP 5 13 M/L LIGAMAX5 (MISCELLANEOUS) ×3
APR CLP MED LRG 5 ANG JAW (MISCELLANEOUS) ×1
BAG SPEC RTRVL 10 TROC 200 (ENDOMECHANICALS) ×1
BLADE SURG ROTATE 9660 (MISCELLANEOUS) IMPLANT
CANISTER SUCTION 2500CC (MISCELLANEOUS) ×3 IMPLANT
CHLORAPREP W/TINT 26ML (MISCELLANEOUS) ×3 IMPLANT
CLIP APPLIE 5 13 M/L LIGAMAX5 (MISCELLANEOUS) ×1 IMPLANT
COVER MAYO STAND STRL (DRAPES) ×3 IMPLANT
COVER SURGICAL LIGHT HANDLE (MISCELLANEOUS) ×3 IMPLANT
DRAPE C-ARM 42X72 X-RAY (DRAPES) ×3 IMPLANT
ELECT REM PT RETURN 9FT ADLT (ELECTROSURGICAL) ×3
ELECTRODE REM PT RTRN 9FT ADLT (ELECTROSURGICAL) ×1 IMPLANT
FILTER SMOKE EVAC LAPAROSHD (FILTER) IMPLANT
GLOVE BIO SURGEON STRL SZ8 (GLOVE) ×3 IMPLANT
GLOVE BIOGEL PI IND STRL 8 (GLOVE) ×1 IMPLANT
GLOVE BIOGEL PI INDICATOR 8 (GLOVE) ×2
GOWN STRL REUS W/ TWL LRG LVL3 (GOWN DISPOSABLE) ×2 IMPLANT
GOWN STRL REUS W/ TWL XL LVL3 (GOWN DISPOSABLE) ×1 IMPLANT
GOWN STRL REUS W/TWL LRG LVL3 (GOWN DISPOSABLE) ×3
GOWN STRL REUS W/TWL XL LVL3 (GOWN DISPOSABLE) ×6
KIT BASIN OR (CUSTOM PROCEDURE TRAY) ×3 IMPLANT
KIT ROOM TURNOVER OR (KITS) ×3 IMPLANT
LIQUID BAND (GAUZE/BANDAGES/DRESSINGS) ×3 IMPLANT
NS IRRIG 1000ML POUR BTL (IV SOLUTION) ×3 IMPLANT
PAD ARMBOARD 7.5X6 YLW CONV (MISCELLANEOUS) ×3 IMPLANT
POUCH RETRIEVAL ECOSAC 10 (ENDOMECHANICALS) ×1 IMPLANT
POUCH RETRIEVAL ECOSAC 10MM (ENDOMECHANICALS) ×2
SCISSORS LAP 5X35 DISP (ENDOMECHANICALS) ×3 IMPLANT
SET CHOLANGIOGRAPH 5 50 .035 (SET/KITS/TRAYS/PACK) ×3 IMPLANT
SET IRRIG TUBING LAPAROSCOPIC (IRRIGATION / IRRIGATOR) ×3 IMPLANT
SLEEVE ENDOPATH XCEL 5M (ENDOMECHANICALS) ×6 IMPLANT
SPECIMEN JAR SMALL (MISCELLANEOUS) ×3 IMPLANT
SUT VIC AB 4-0 PS2 27 (SUTURE) ×3 IMPLANT
TOWEL OR 17X24 6PK STRL BLUE (TOWEL DISPOSABLE) ×3 IMPLANT
TOWEL OR 17X26 10 PK STRL BLUE (TOWEL DISPOSABLE) ×3 IMPLANT
TRAY LAPAROSCOPIC (CUSTOM PROCEDURE TRAY) ×3 IMPLANT
TROCAR XCEL BLUNT TIP 100MML (ENDOMECHANICALS) ×3 IMPLANT
TROCAR XCEL NON-BLD 5MMX100MML (ENDOMECHANICALS) ×3 IMPLANT
TUBING INSUFFLATION (TUBING) ×3 IMPLANT

## 2015-04-04 NOTE — Anesthesia Preprocedure Evaluation (Addendum)
Anesthesia Evaluation  Patient identified by MRN, date of birth, ID band Patient awake    Reviewed: Allergy & Precautions, NPO status , Patient's Chart, lab work & pertinent test results  Airway Mallampati: II  TM Distance: >3 FB Neck ROM: Full    Dental  (+) Teeth Intact, Dental Advisory Given   Pulmonary Current Smoker,  breath sounds clear to auscultation        Cardiovascular Rhythm:Regular Rate:Normal     Neuro/Psych    GI/Hepatic (+)     substance abuse  alcohol use,   Endo/Other    Renal/GU      Musculoskeletal   Abdominal   Peds  Hematology   Anesthesia Other Findings   Reproductive/Obstetrics                            Anesthesia Physical Anesthesia Plan  ASA: I  Anesthesia Plan: General   Post-op Pain Management:    Induction: Intravenous  Airway Management Planned: Oral ETT  Additional Equipment:   Intra-op Plan:   Post-operative Plan: Extubation in OR  Informed Consent: I have reviewed the patients History and Physical, chart, labs and discussed the procedure including the risks, benefits and alternatives for the proposed anesthesia with the patient or authorized representative who has indicated his/her understanding and acceptance.   Dental advisory given  Plan Discussed with: CRNA, Anesthesiologist and Surgeon  Anesthesia Plan Comments:         Anesthesia Quick Evaluation

## 2015-04-04 NOTE — Anesthesia Procedure Notes (Signed)
Procedure Name: Intubation Date/Time: 04/04/2015 11:13 AM Performed by: Alanda Amass A Pre-anesthesia Checklist: Patient identified, Timeout performed, Emergency Drugs available, Suction available and Patient being monitored Patient Re-evaluated:Patient Re-evaluated prior to inductionOxygen Delivery Method: Circle system utilized Preoxygenation: Pre-oxygenation with 100% oxygen Intubation Type: IV induction Ventilation: Mask ventilation without difficulty Laryngoscope Size: Mac and 3 Grade View: Grade III Tube type: Oral Tube size: 7.5 mm Number of attempts: 1 Airway Equipment and Method: Stylet Placement Confirmation: ETT inserted through vocal cords under direct vision,  breath sounds checked- equal and bilateral and positive ETCO2 Secured at: 22 cm Tube secured with: Tape Dental Injury: Teeth and Oropharynx as per pre-operative assessment

## 2015-04-04 NOTE — Progress Notes (Signed)
  Subjective: RUQ pain  Objective: Vital signs in last 24 hours: Temp:  [97.5 F (36.4 C)-98.2 F (36.8 C)] 97.8 F (36.6 C) (06/18 0626) Pulse Rate:  [49-68] 57 (06/18 0626) Resp:  [18] 18 (06/18 0119) BP: (102-131)/(64-97) 108/66 mmHg (06/18 0626) SpO2:  [95 %-100 %] 100 % (06/18 0626) Weight:  [90.719 kg (200 lb)] 90.719 kg (200 lb) (06/18 0649) Last BM Date: 04/02/15  Intake/Output from previous day: 06/17 0701 - 06/18 0700 In: 1240 [P.O.:240; I.V.:1000] Out: 1500 [Urine:1500] Intake/Output this shift:    General appearance: alert and cooperative Resp: clear to auscultation bilaterally Cardio: regular rate and rhythm GI: soft, tender RUQ  Lab Results:   Recent Labs  04/03/15 1524 04/04/15 0343  WBC 7.0 9.2  HGB 13.6 13.7  HCT 40.1 40.8  PLT 248 265   BMET  Recent Labs  04/03/15 0950 04/03/15 1524 04/04/15 0343  NA 138  --  140  K 3.7  --  4.8  CL 106  --  105  CO2 25  --  28  GLUCOSE 100*  --  87  BUN 13  --  12  CREATININE 0.99 1.07 1.18  CALCIUM 9.1  --  8.8*   PT/INR  Recent Labs  04/04/15 0343  LABPROT 13.8  INR 1.04   ABG No results for input(s): PHART, HCO3 in the last 72 hours.  Invalid input(s): PCO2, PO2  Studies/Results: US Abdomen Limited Ruq  04/03/2015   CLINICAL DATA:  Pain and anxiety.  Abdominal pain.  EXAM: US ABDOMEN LIMITED - RIGHT UPPER QUADRANT  COMPARISON:  03/20/2015  FINDINGS: Gallbladder:  No gallstones or wall thickening visualized. Suspected adherent sludge along the inferior margin of the gallbladder, ill-defined. No sonographic Murphy sign noted.  Common bile duct:  Diameter: 5 mm  Liver:  No focal lesion identified. Within normal limits in parenchymal echogenicity.  IMPRESSION: 1. Small amount of adherent sludge along the inferior portion of the gallbladder. No shadowing to favor gallstone. Otherwise negative.   Electronically Signed   By: Gaylyn Rong M.D.   On: 04/03/2015 13:28     Anti-infectives: Anti-infectives    Start     Dose/Rate Route Frequency Ordered Stop   04/03/15 1330  cefTRIAXone (ROCEPHIN) 2 g in dextrose 5 % 50 mL IVPB     2 g 100 mL/hr over 30 Minutes Intravenous Every 24 hours 04/03/15 1320        Assessment/Plan: Acute cholecystitis - to OR for lap chole today. Procedure, risks, benefits D/W him and his wife. He agrees. On Rocephin IV.   LOS: 1 day    Zula Hovsepian E 04/04/2015

## 2015-04-04 NOTE — Anesthesia Postprocedure Evaluation (Signed)
  Anesthesia Post-op Note  Patient: Jacob Sheppard  Procedure(s) Performed: Procedure(s): LAPAROSCOPIC CHOLECYSTECTOMY WITH INTRAOPERATIVE CHOLANGIOGRAM (N/A)  Patient Location: PACU  Anesthesia Type: General   Level of Consciousness: awake, alert  and oriented  Airway and Oxygen Therapy: Patient Spontanous Breathing  Post-op Pain: mild  Post-op Assessment: Post-op Vital signs reviewed  Post-op Vital Signs: Reviewed  Last Vitals:  Filed Vitals:   04/04/15 1315  BP: 111/68  Pulse: 74  Temp:   Resp: 14    Complications: No apparent anesthesia complications

## 2015-04-04 NOTE — Op Note (Signed)
04/03/2015 - 04/04/2015  11:58 AM  PATIENT:  Jacob Sheppard  49 y.o. male  PRE-OPERATIVE DIAGNOSIS: cholecystitis  POST-OPERATIVE DIAGNOSIS:  cholecystitis  PROCEDURE:  Procedure(s): LAPAROSCOPIC CHOLECYSTECTOMY WITH INTRAOPERATIVE CHOLANGIOGRAM  SURGEON:  Violeta Gelinas, M.D.  ASSISTANTS: Jaclynn Guarneri, M.D.   ANESTHESIA:   local and general  EBL:  Total I/O In: 1350 [I.V.:1350] Out: 5 [Blood:5]  BLOOD ADMINISTERED:none  DRAINS: none   SPECIMEN:  Excision  DISPOSITION OF SPECIMEN:  PATHOLOGY  COUNTS:  YES  DICTATION: .Dragon Dictation [name] is brought for laparoscopic cholecystectomy. He was identified in the preop holding area. Informed consent was obtained. He received intravenous antibiotics. He was brought to the operating room and general endotracheal anesthesia was administered by the anesthesia staff. His abdomen was prepped and draped in sterile fashion. We did a time out procedure. Infra-umbilical region was infiltrated with local. Infraumbilical incision was made. Subcutaneous tissues were dissected down revealing the anterior fascia. This was divided sharply along the midline. Cavity was entered under direct vision. 0 Vicryl pursestring was placed on the fascial opening. Hassan trocar was inserted. Abdomen was insufflated with carbon monoxide in standard fashion. Under direct vision, 5 mm epigastric and 5 mm right lateral ports 2 were placed. Local was used at each port site. Endoscopic exploration revealed some signs of acute and chronic inflammation of the gallbladder. The dome was retracted superior medially. The infundibulum was covered with omental adhesions. These were carefully taken down using cautery. This exposed the body and infundibulum of the gallbladder. The infundibulum was retracted inferomedially. Dissection began laterally and progressed medially easily identifying the cystic duct and cystic artery. Dissection continued until we had a critical view between  infundibulum, cystic duct, & liver. Once this was achieved a clip was placed on the cystic duct junction, small nick was made in the cystic duct and a cholangiocatheter was inserted. Intraoperative cringed revealed no common bile duct filling defects, and good flow of contrast into the duodenum. An air bubble was seen in the common hepatic duct. Contrast can catheter was removed. Recuts were placed proximal the cystic duct and it was divided. 2 clips were placed proximally on the cystic artery and it was divided distally. Doppler signals liver bed with Bovie cautery and placed into a bag and removed from the infra umbilical port site. Liver bed was cauterized getting good hemostasis. Clips remain in excellent position. The area was copiously irrigated and irrigation fluid returned clear. Liver bed was dry. Ports removed under direct vision. Pneumoperitoneum was released. Informed local fascia was closed by tying the pursestring. All 4 wounds were copiously irrigated and the skin of each was closed with running 4 Vicryl subcuticular followed by liquid band. All counts were correct. Patient tolerated procedure well without apparent palpitations taken recovery in stable condition. PATIENT DISPOSITION:  PACU - hemodynamically stable.   Delay start of Pharmacological VTE agent (>24hrs) due to surgical blood loss or risk of bleeding:  no  Violeta Gelinas, MD, MPH, FACS Pager: [phone]  6/18/201611:58 AM

## 2015-04-04 NOTE — Transfer of Care (Signed)
Immediate Anesthesia Transfer of Care Note  Patient: Jacob Sheppard  Procedure(s) Performed: Procedure(s): LAPAROSCOPIC CHOLECYSTECTOMY WITH INTRAOPERATIVE CHOLANGIOGRAM (N/A)  Patient Location: PACU  Anesthesia Type:General  Level of Consciousness: awake  Airway & Oxygen Therapy: Patient Spontanous Breathing and Patient connected to nasal cannula oxygen  Post-op Assessment: Report given to RN and Post -op Vital signs reviewed and stable  Post vital signs: Reviewed and stable  Last Vitals:  Filed Vitals:   04/04/15 0626  BP: 108/66  Pulse: 57  Temp: 36.6 C  Resp:     Complications: No apparent anesthesia complications

## 2015-04-05 MED ORDER — OXYCODONE HCL 5 MG PO TABS: 5.0000 mg | ORAL_TABLET | ORAL | Status: DC | PRN

## 2015-04-05 MED ORDER — PANTOPRAZOLE SODIUM 40 MG PO TBEC: 40.0000 mg | DELAYED_RELEASE_TABLET | Freq: Every day | ORAL | Status: DC

## 2015-04-05 NOTE — Progress Notes (Signed)
Discharge paperwork given. Prescriptions given to patient. No questions verbalized.

## 2015-04-05 NOTE — Progress Notes (Signed)
Patient ID: Jacob Sheppard, male   DOB: 11-18-1965, 49 y.o.   MRN: [id number] 1 Day Post-Op  Subjective: No complaints. Feels much better than preop. Tolerating diet. Just sore.  Objective: Vital signs in last 24 hours: Temp:  [97.5 F (36.4 C)-98.7 F (37.1 C)] 97.8 F (36.6 C) (06/19 0508) Pulse Rate:  [52-77] 56 (06/19 0508) Resp:  [14-20] 18 (06/19 0508) BP: (105-136)/(60-84) 109/61 mmHg (06/19 0508) SpO2:  [93 %-100 %] 100 % (06/19 0508) Last BM Date: 04/02/15  Intake/Output from previous day: 06/18 0701 - 06/19 0700 In: 3575 [P.O.:720; I.V.:2855] Out: 1030 [Urine:1025; Blood:5] Intake/Output this shift:    General appearance: alert, cooperative and no distress GI: soft and nontender Incision/Wound: Clean and dry  Lab Results:   Recent Labs  04/03/15 1524 04/04/15 0343  WBC 7.0 9.2  HGB 13.6 13.7  HCT 40.1 40.8  PLT 248 265   BMET  Recent Labs  04/03/15 0950 04/03/15 1524 04/04/15 0343  NA 138  --  140  K 3.7  --  4.8  CL 106  --  105  CO2 25  --  28  GLUCOSE 100*  --  87  BUN 13  --  12  CREATININE 0.99 1.07 1.18  CALCIUM 9.1  --  8.8*     Studies/Results: Dg Cholangiogram Operative  04/04/2015   CLINICAL DATA:  Intraoperative cholangiogram, cholecystitis  EXAM: INTRAOPERATIVE CHOLANGIOGRAM  TECHNIQUE: Cholangiographic images from the C-arm fluoroscopic device were submitted for interpretation post-operatively. Please see the procedural report for the amount of contrast and the fluoroscopy time utilized.  COMPARISON:  Ultrasound 04/03/2015  FINDINGS: Mobile filling defects are identified within the common duct proximal portion. No common duct dilatation. Contrast tracks to the duodenum. No intrahepatic ductal dilatation.  IMPRESSION: Mobile filling defects within the normal caliber common duct which could represent stones although gas bubbles could appear similar.   Electronically Signed   By: Christiana Pellant M.D.   On: 04/04/2015 12:15   US Abdomen  Limited Ruq  04/03/2015   CLINICAL DATA:  Pain and anxiety.  Abdominal pain.  EXAM: US ABDOMEN LIMITED - RIGHT UPPER QUADRANT  COMPARISON:  03/20/2015  FINDINGS: Gallbladder:  No gallstones or wall thickening visualized. Suspected adherent sludge along the inferior margin of the gallbladder, ill-defined. No sonographic Murphy sign noted.  Common bile duct:  Diameter: 5 mm  Liver:  No focal lesion identified. Within normal limits in parenchymal echogenicity.  IMPRESSION: 1. Small amount of adherent sludge along the inferior portion of the gallbladder. No shadowing to favor gallstone. Otherwise negative.   Electronically Signed   By: Gaylyn Rong M.D.   On: 04/03/2015 13:28    Anti-infectives: Anti-infectives    Start     Dose/Rate Route Frequency Ordered Stop   04/03/15 1330  cefTRIAXone (ROCEPHIN) 2 g in dextrose 5 % 50 mL IVPB     2 g 100 mL/hr over 30 Minutes Intravenous Every 24 hours 04/03/15 1320        Assessment/Plan: s/p Procedure(s): LAPAROSCOPIC CHOLECYSTECTOMY WITH INTRAOPERATIVE CHOLANGIOGRAM Doing very well postoperatively. Okay for discharge.   LOS: 2 days    Lexie Koehl T 04/05/2015

## 2015-04-05 NOTE — Discharge Summary (Signed)
   Patient ID: Jacob Sheppard [id number] 49 y.o. 05-Nov-1965  04/03/2015  Discharge date and time: 04/05/2015   Admitting Physician: Emelia Loron  Discharge Physician: Glenna Fellows T  Admission Diagnoses: Cholelithiasis and acute cholecystitis  Discharge Diagnoses: Cholelithiasis and chronic cholecystitis with biliary colic  Operations: Procedure(s): LAPAROSCOPIC CHOLECYSTECTOMY WITH INTRAOPERATIVE CHOLANGIOGRAM  Dr. Delaney Meigs  Admission Condition: fair  Discharged Condition: good  Indication for Admission: Patient has known gallstones. Has been symptomatic and was scheduled for surgery. Admitted with persistent severe pain unresponsive to medications.  Hospital Course: Patient was admitted and treated with antibiotics, pain medicine and IV fluid. The following day he was taken to the operating room and underwent laparoscopic cholecystectomy with cholangiogram by Dr. Violeta Gelinas. On the first postoperative day his pain is relieved. Just having soreness. Tolerating diet. Abdomen is benign and wounds clean. Ready for discharge.  Disposition: Home  Patient Instructions:    Medication List    STOP taking these medications        LORazepam 1 MG tablet  Commonly known as:  ATIVAN     oxyCODONE-acetaminophen 5-325 MG per tablet  Commonly known as:  PERCOCET      TAKE these medications        IBUPROFEN PO  Take 800 mg by mouth 3 (three) times daily as needed (as needed for headache or pain).     MULTIVITAMIN ADULT PO  Take 1 packet by mouth daily.     ondansetron 4 MG tablet  Commonly known as:  ZOFRAN  Take 1 tablet (4 mg total) by mouth every 6 (six) hours as needed for nausea or vomiting.     oxyCODONE 5 MG immediate release tablet  Commonly known as:  Oxy IR/ROXICODONE  Take 1-2 tablets (5-10 mg total) by mouth every 4 (four) hours as needed (pain).        Activity: no heavy lifting for 2 weeks Diet: regular diet Wound Care: none  needed  Follow-up:  With Dr. Janee Morn in 2 weeks.  Signed: Mariella Saa MD, FACS  04/05/2015, 8:21 AM

## 2015-04-05 NOTE — Discharge Instructions (Signed)
[facility] ______[facility] °LAPAROSCOPIC SURGERY: POST OP INSTRUCTIONS °Always review your discharge instruction sheet given to you by the facility where your surgery was performed. °IF YOU HAVE DISABILITY OR FAMILY LEAVE FORMS, YOU MUST BRING THEM TO THE OFFICE FOR PROCESSING.   °DO NOT GIVE THEM TO YOUR DOCTOR. ° °1. A prescription for pain medication may be given to you upon discharge.  Take your pain medication as prescribed, if needed.  If narcotic pain medicine is not needed, then you may take acetaminophen (Tylenol) or ibuprofen (Advil) as needed. °2. Take your usually prescribed medications unless otherwise directed. °3. If you need a refill on your pain medication, please contact your pharmacy.  They will contact our office to request authorization. Prescriptions will not be filled after 5pm or on week-ends. °4. You should follow a light diet the first few days after arrival home, such as soup and crackers, etc.  Be sure to include lots of fluids daily. °5. Most patients will experience some swelling and bruising in the area of the incisions.  Ice packs will help.  Swelling and bruising can take several days to resolve.  °6. It is common to experience some constipation if taking pain medication after surgery.  Increasing fluid intake and taking a stool softener (such as Colace) will usually help or prevent this problem from occurring.  A mild laxative (Milk of Magnesia or Miralax) should be taken according to package instructions if there are no bowel movements after 48 hours. °7. Unless discharge instructions indicate otherwise, you may remove your bandages 24-48 hours after surgery, and you may shower at that time.  You may have steri-strips (small skin tapes) in place directly over the incision.  These strips should be left on the skin for 7-10 days.  If your surgeon used skin glue on the incision, you may shower in 24 hours.  The glue will flake off over the next 2-3 weeks.  Any sutures or  staples will be removed at the office during your follow-up visit. °8. ACTIVITIES:  You may resume regular (light) daily activities beginning the next day--such as daily self-care, walking, climbing stairs--gradually increasing activities as tolerated.  You may have sexual intercourse when it is comfortable.  Refrain from any heavy lifting or straining until approved by your doctor. °a. You may drive when you are no longer taking prescription pain medication, you can comfortably wear a seatbelt, and you can safely maneuver your car and apply brakes. °b. RETURN TO WORK:  __________________________________________________________ °9. You should see your doctor in the office for a follow-up appointment approximately 2-3 weeks after your surgery.  Make sure that you call for this appointment within a day or two after you arrive home to insure a convenient appointment time. °10. OTHER INSTRUCTIONS: __________________________________________________________________________________________________________________________ __________________________________________________________________________________________________________________________ °WHEN TO CALL YOUR DOCTOR: °1. Fever over 101.0 °2. Inability to urinate °3. Continued bleeding from incision. °4. Increased pain, redness, or drainage from the incision. °5. Increasing abdominal pain ° °The clinic staff is available to answer your questions during regular business hours.  Please don’t hesitate to call and ask to speak to one of the nurses for clinical concerns.  If you have a medical emergency, go to the nearest emergency room or call 911.  A surgeon from [facility] is always on call at the hospital. °[address], [address], [place], [place]  [address] ? [address] [address], [place], [place]   [address] °[phone] ? [phone] ? FAX [phone] °Web site:   [email or web address] °

## 2015-04-06 ENCOUNTER — Inpatient Hospital Stay (HOSPITAL_COMMUNITY): Admission: RE | Admit: 2015-04-06 | Payer: 59 | Source: Ambulatory Visit

## 2015-04-06 ENCOUNTER — Encounter (HOSPITAL_COMMUNITY): Payer: Self-pay | Admitting: General Surgery

## 2015-09-20 ENCOUNTER — Observation Stay (HOSPITAL_COMMUNITY): Payer: 59 | Admitting: Anesthesiology

## 2015-09-20 ENCOUNTER — Encounter (HOSPITAL_COMMUNITY)
Admission: EM | Disposition: A | Discharge status: Home / Self Care | Payer: Self-pay | Source: Home / Self Care | Attending: Emergency Medicine

## 2015-09-20 ENCOUNTER — Observation Stay (HOSPITAL_COMMUNITY)
Admission: EM | Admit: 2015-09-20 | Discharge: 2015-09-20 | Disposition: A | Discharge status: Home / Self Care | Payer: 59 | Attending: General Surgery | Admitting: General Surgery

## 2015-09-20 ENCOUNTER — Emergency Department (HOSPITAL_COMMUNITY): Payer: 59

## 2015-09-20 ENCOUNTER — Encounter (HOSPITAL_COMMUNITY): Payer: Self-pay | Admitting: *Deleted

## 2015-09-20 DIAGNOSIS — F17210 Nicotine dependence, cigarettes, uncomplicated: Secondary | ICD-10-CM | POA: Insufficient documentation

## 2015-09-20 DIAGNOSIS — K3580 Unspecified acute appendicitis: Secondary | ICD-10-CM | POA: Diagnosis present

## 2015-09-20 HISTORY — PX: LAPAROSCOPIC APPENDECTOMY: SHX408

## 2015-09-20 LAB — COMPREHENSIVE METABOLIC PANEL
ALT: 28 U/L (ref 17–63)
ANION GAP: 11 (ref 5–15)
AST: 26 U/L (ref 15–41)
Albumin: 4.1 g/dL (ref 3.5–5.0)
Alkaline Phosphatase: 64 U/L (ref 38–126)
BUN: 15 mg/dL (ref 6–20)
CHLORIDE: 102 mmol/L (ref 101–111)
CO2: 24 mmol/L (ref 22–32)
Calcium: 9.7 mg/dL (ref 8.9–10.3)
Creatinine, Ser: 1.15 mg/dL (ref 0.61–1.24)
Glucose, Bld: 130 mg/dL — ABNORMAL HIGH (ref 65–99)
POTASSIUM: 3.8 mmol/L (ref 3.5–5.1)
Sodium: 137 mmol/L (ref 135–145)
Total Bilirubin: 0.8 mg/dL (ref 0.3–1.2)
Total Protein: 6.9 g/dL (ref 6.5–8.1)

## 2015-09-20 LAB — CBC
HEMATOCRIT: 43.8 % (ref 39.0–52.0)
HEMATOCRIT: 39.8 % (ref 39.0–52.0)
HEMOGLOBIN: 15 g/dL (ref 13.0–17.0)
Hemoglobin: 13 g/dL (ref 13.0–17.0)
MCH: 31.9 pg (ref 26.0–34.0)
MCH: 30.5 pg (ref 26.0–34.0)
MCHC: 34.2 g/dL (ref 30.0–36.0)
MCHC: 32.7 g/dL (ref 30.0–36.0)
MCV: 93.2 fL (ref 78.0–100.0)
MCV: 93.4 fL (ref 78.0–100.0)
PLATELETS: 218 10*3/uL (ref 150–400)
Platelets: 247 10*3/uL (ref 150–400)
RBC: 4.7 MIL/uL (ref 4.22–5.81)
RBC: 4.26 MIL/uL (ref 4.22–5.81)
RDW: 13.3 % (ref 11.5–15.5)
RDW: 13.5 % (ref 11.5–15.5)
WBC: 14.1 10*3/uL — AB (ref 4.0–10.5)
WBC: 11.2 10*3/uL — AB (ref 4.0–10.5)

## 2015-09-20 LAB — URINALYSIS, ROUTINE W REFLEX MICROSCOPIC
GLUCOSE, UA: NEGATIVE mg/dL
Hgb urine dipstick: NEGATIVE
Ketones, ur: 15 mg/dL — AB
Leukocytes, UA: NEGATIVE
Nitrite: NEGATIVE
PH: 5 (ref 5.0–8.0)
Protein, ur: NEGATIVE mg/dL
Specific Gravity, Urine: 1.029 (ref 1.005–1.030)

## 2015-09-20 LAB — CREATININE, SERUM
Creatinine, Ser: 1.05 mg/dL (ref 0.61–1.24)
GFR calc Af Amer: >60 mL/min (ref >60)
GFR calc non Af Amer: >60 mL/min (ref >60)

## 2015-09-20 LAB — LIPASE, BLOOD: LIPASE: 29 U/L (ref 11–51)

## 2015-09-20 SURGERY — APPENDECTOMY, LAPAROSCOPIC
Anesthesia: General | Site: Abdomen

## 2015-09-20 MED ORDER — SODIUM CHLORIDE 0.9 % IV SOLN
INTRAVENOUS | Status: DC
  Administered 2015-09-20: 12:00:00 via INTRAVENOUS

## 2015-09-20 MED ORDER — SUGAMMADEX SODIUM 200 MG/2ML IV SOLN
INTRAVENOUS | Status: DC | PRN
  Administered 2015-09-20: 186.8 mg via INTRAVENOUS

## 2015-09-20 MED ORDER — KETOROLAC TROMETHAMINE 30 MG/ML IJ SOLN
30.0000 mg | Freq: Four times a day (QID) | INTRAMUSCULAR | Status: DC
  Administered 2015-09-20: 30 mg via INTRAVENOUS
  Filled 2015-09-20: qty 1

## 2015-09-20 MED ORDER — PROPOFOL 10 MG/ML IV BOLUS
INTRAVENOUS | Status: DC | PRN
  Administered 2015-09-20: 175 mg via INTRAVENOUS

## 2015-09-20 MED ORDER — DEXTROSE 5 % IV SOLN
2.0000 g | INTRAVENOUS | Status: DC
  Administered 2015-09-20: 2 g via INTRAVENOUS
  Filled 2015-09-20: qty 2

## 2015-09-20 MED ORDER — OXYCODONE-ACETAMINOPHEN 5-325 MG PO TABS: 1.0000 | ORAL_TABLET | ORAL | Status: DC | PRN

## 2015-09-20 MED ORDER — OXYCODONE-ACETAMINOPHEN 5-325 MG PO TABS
1.0000 | ORAL_TABLET | ORAL | Status: DC | PRN
  Administered 2015-09-20: 2 via ORAL
  Filled 2015-09-20: qty 2

## 2015-09-20 MED ORDER — DIPHENHYDRAMINE HCL 25 MG PO CAPS: 25.0000 mg | ORAL_CAPSULE | Freq: Four times a day (QID) | ORAL | Status: DC | PRN

## 2015-09-20 MED ORDER — LACTATED RINGERS IV SOLN: INTRAVENOUS | Status: DC

## 2015-09-20 MED ORDER — SUGAMMADEX SODIUM 200 MG/2ML IV SOLN
INTRAVENOUS | Status: AC
  Filled 2015-09-20: qty 2

## 2015-09-20 MED ORDER — GLYCOPYRROLATE 0.2 MG/ML IJ SOLN
INTRAMUSCULAR | Status: DC | PRN
  Administered 2015-09-20: 0.2 mg via INTRAVENOUS

## 2015-09-20 MED ORDER — METRONIDAZOLE IN NACL 5-0.79 MG/ML-% IV SOLN
500.0000 mg | Freq: Four times a day (QID) | INTRAVENOUS | Status: DC
  Filled 2015-09-20 (×2): qty 100

## 2015-09-20 MED ORDER — ACETAMINOPHEN 650 MG RE SUPP: 650.0000 mg | Freq: Four times a day (QID) | RECTAL | Status: DC | PRN

## 2015-09-20 MED ORDER — LACTATED RINGERS IV SOLN
INTRAVENOUS | Status: DC | PRN
  Administered 2015-09-20 (×2): via INTRAVENOUS

## 2015-09-20 MED ORDER — LIDOCAINE HCL (CARDIAC) 20 MG/ML IV SOLN
INTRAVENOUS | Status: DC | PRN
  Administered 2015-09-20: 30 mg via INTRAVENOUS

## 2015-09-20 MED ORDER — KETOROLAC TROMETHAMINE 30 MG/ML IJ SOLN: 30.0000 mg | Freq: Four times a day (QID) | INTRAMUSCULAR | Status: DC | PRN

## 2015-09-20 MED ORDER — METRONIDAZOLE IN NACL 5-0.79 MG/ML-% IV SOLN
500.0000 mg | Freq: Three times a day (TID) | INTRAVENOUS | Status: DC
  Administered 2015-09-20: 500 mg via INTRAVENOUS
  Filled 2015-09-20 (×3): qty 100

## 2015-09-20 MED ORDER — SUCCINYLCHOLINE CHLORIDE 20 MG/ML IJ SOLN
INTRAMUSCULAR | Status: AC
  Filled 2015-09-20: qty 1

## 2015-09-20 MED ORDER — 0.9 % SODIUM CHLORIDE (POUR BTL) OPTIME
TOPICAL | Status: DC | PRN
  Administered 2015-09-20: 1000 mL

## 2015-09-20 MED ORDER — BUPIVACAINE-EPINEPHRINE 0.25% -1:200000 IJ SOLN
INTRAMUSCULAR | Status: DC | PRN
  Administered 2015-09-20: 30 mL

## 2015-09-20 MED ORDER — HYDROMORPHONE HCL 1 MG/ML IJ SOLN: 1.0000 mg | INTRAMUSCULAR | Status: DC | PRN

## 2015-09-20 MED ORDER — SODIUM CHLORIDE 0.9 % IR SOLN
Status: DC | PRN
  Administered 2015-09-20: 1000 mL

## 2015-09-20 MED ORDER — IOHEXOL 300 MG/ML  SOLN
100.0000 mL | Freq: Once | INTRAMUSCULAR | Status: AC | PRN
  Administered 2015-09-20: 100 mL via INTRAVENOUS

## 2015-09-20 MED ORDER — ENOXAPARIN SODIUM 40 MG/0.4ML ~~LOC~~ SOLN: 40.0000 mg | SUBCUTANEOUS | Status: DC

## 2015-09-20 MED ORDER — ONDANSETRON HCL 4 MG/2ML IJ SOLN
INTRAMUSCULAR | Status: DC | PRN
Start: 2015-09-20 — End: 2015-09-20
  Administered 2015-09-20: 4 mg via INTRAVENOUS

## 2015-09-20 MED ORDER — FENTANYL CITRATE (PF) 250 MCG/5ML IJ SOLN
INTRAMUSCULAR | Status: AC
  Filled 2015-09-20: qty 5

## 2015-09-20 MED ORDER — MIDAZOLAM HCL 2 MG/2ML IJ SOLN
INTRAMUSCULAR | Status: AC
  Filled 2015-09-20: qty 2

## 2015-09-20 MED ORDER — ONDANSETRON HCL 4 MG/2ML IJ SOLN
INTRAMUSCULAR | Status: AC
  Filled 2015-09-20: qty 2

## 2015-09-20 MED ORDER — SODIUM CHLORIDE 0.9 % IV BOLUS (SEPSIS)
1000.0000 mL | Freq: Once | INTRAVENOUS | Status: AC
  Administered 2015-09-20: 1000 mL via INTRAVENOUS

## 2015-09-20 MED ORDER — PROPOFOL 10 MG/ML IV BOLUS
INTRAVENOUS | Status: AC
  Filled 2015-09-20: qty 20

## 2015-09-20 MED ORDER — ONDANSETRON HCL 4 MG/2ML IJ SOLN: 4.0000 mg | Freq: Four times a day (QID) | INTRAMUSCULAR | Status: DC | PRN

## 2015-09-20 MED ORDER — ACETAMINOPHEN 325 MG PO TABS: 650.0000 mg | ORAL_TABLET | Freq: Four times a day (QID) | ORAL | Status: DC | PRN

## 2015-09-20 MED ORDER — BUPIVACAINE-EPINEPHRINE (PF) 0.25% -1:200000 IJ SOLN
INTRAMUSCULAR | Status: AC
  Filled 2015-09-20: qty 30

## 2015-09-20 MED ORDER — EPHEDRINE SULFATE 50 MG/ML IJ SOLN
INTRAMUSCULAR | Status: AC
  Filled 2015-09-20: qty 1

## 2015-09-20 MED ORDER — FENTANYL CITRATE (PF) 100 MCG/2ML IJ SOLN
INTRAMUSCULAR | Status: DC | PRN
  Administered 2015-09-20: 50 ug via INTRAVENOUS
  Administered 2015-09-20: 100 ug via INTRAVENOUS

## 2015-09-20 MED ORDER — LIDOCAINE HCL (CARDIAC) 20 MG/ML IV SOLN
INTRAVENOUS | Status: AC
  Filled 2015-09-20: qty 5

## 2015-09-20 MED ORDER — ROCURONIUM BROMIDE 50 MG/5ML IV SOLN
INTRAVENOUS | Status: AC
  Filled 2015-09-20: qty 1

## 2015-09-20 MED ORDER — GLYCOPYRROLATE 0.2 MG/ML IJ SOLN
INTRAMUSCULAR | Status: AC
  Filled 2015-09-20: qty 1

## 2015-09-20 MED ORDER — HYDROCODONE-ACETAMINOPHEN 5-325 MG PO TABS
1.0000 | ORAL_TABLET | ORAL | Status: DC | PRN
  Administered 2015-09-20: 2 via ORAL
  Filled 2015-09-20: qty 2

## 2015-09-20 MED ORDER — MORPHINE SULFATE (PF) 4 MG/ML IV SOLN
6.0000 mg | Freq: Once | INTRAVENOUS | Status: AC
  Administered 2015-09-20: 6 mg via INTRAVENOUS
  Filled 2015-09-20: qty 2

## 2015-09-20 MED ORDER — ONDANSETRON 4 MG PO TBDP: 4.0000 mg | ORAL_TABLET | Freq: Four times a day (QID) | ORAL | Status: DC | PRN

## 2015-09-20 MED ORDER — SUCCINYLCHOLINE CHLORIDE 20 MG/ML IJ SOLN
INTRAMUSCULAR | Status: DC | PRN
  Administered 2015-09-20: 140 mg via INTRAVENOUS

## 2015-09-20 MED ORDER — ROCURONIUM BROMIDE 100 MG/10ML IV SOLN
INTRAVENOUS | Status: DC | PRN
  Administered 2015-09-20: 35 mg via INTRAVENOUS

## 2015-09-20 MED ORDER — FENTANYL CITRATE (PF) 100 MCG/2ML IJ SOLN: 25.0000 ug | INTRAMUSCULAR | Status: DC | PRN

## 2015-09-20 MED ORDER — ONDANSETRON HCL 4 MG/2ML IJ SOLN
4.0000 mg | Freq: Once | INTRAMUSCULAR | Status: AC
  Administered 2015-09-20: 4 mg via INTRAVENOUS
  Filled 2015-09-20: qty 2

## 2015-09-20 MED ORDER — SODIUM CHLORIDE 0.9 % IJ SOLN
INTRAMUSCULAR | Status: AC
  Filled 2015-09-20: qty 10

## 2015-09-20 MED ORDER — HYDROMORPHONE HCL 1 MG/ML IJ SOLN
1.0000 mg | Freq: Once | INTRAMUSCULAR | Status: AC
  Administered 2015-09-20: 1 mg via INTRAVENOUS
  Filled 2015-09-20: qty 1

## 2015-09-20 MED ORDER — ONDANSETRON HCL 4 MG/2ML IJ SOLN: 4.0000 mg | Freq: Once | INTRAMUSCULAR | Status: DC | PRN

## 2015-09-20 MED ORDER — PHENYLEPHRINE HCL 10 MG/ML IJ SOLN
INTRAMUSCULAR | Status: DC | PRN
  Administered 2015-09-20: 80 ug via INTRAVENOUS
  Administered 2015-09-20: 40 ug via INTRAVENOUS

## 2015-09-20 MED ORDER — DEXTROSE 5 % IV SOLN
2.0000 g | INTRAVENOUS | Status: DC
  Filled 2015-09-20: qty 2

## 2015-09-20 MED ORDER — DIPHENHYDRAMINE HCL 50 MG/ML IJ SOLN: 25.0000 mg | Freq: Four times a day (QID) | INTRAMUSCULAR | Status: DC | PRN

## 2015-09-20 MED ORDER — DEXTROSE 5 % IV SOLN
2.0000 g | INTRAVENOUS | Status: DC
  Administered 2015-09-20: 2 g via INTRAVENOUS

## 2015-09-20 MED ORDER — ZOLPIDEM TARTRATE 5 MG PO TABS: 5.0000 mg | ORAL_TABLET | Freq: Every evening | ORAL | Status: DC | PRN

## 2015-09-20 MED ORDER — MIDAZOLAM HCL 5 MG/5ML IJ SOLN
INTRAMUSCULAR | Status: DC | PRN
  Administered 2015-09-20: 2 mg via INTRAVENOUS

## 2015-09-20 SURGICAL SUPPLY — 47 items
APL SKNCLS STERI-STRIP NONHPOA (GAUZE/BANDAGES/DRESSINGS) ×1
APPLIER CLIP ROT 10 11.4 M/L (STAPLE)
APR CLP MED LRG 11.4X10 (STAPLE)
BAG SPEC RTRVL LRG 6X4 10 (ENDOMECHANICALS) ×1
BENZOIN TINCTURE PRP APPL 2/3 (GAUZE/BANDAGES/DRESSINGS) ×3 IMPLANT
BLADE SURG ROTATE 9660 (MISCELLANEOUS) IMPLANT
CANISTER SUCTION 2500CC (MISCELLANEOUS) ×3 IMPLANT
CHLORAPREP W/TINT 26ML (MISCELLANEOUS) ×3 IMPLANT
CLIP APPLIE ROT 10 11.4 M/L (STAPLE) IMPLANT
CLOSURE WOUND 1/2 X4 (GAUZE/BANDAGES/DRESSINGS) ×1
COVER SURGICAL LIGHT HANDLE (MISCELLANEOUS) ×3 IMPLANT
CUTTER FLEX LINEAR 45M (STAPLE) ×3 IMPLANT
DRSG TEGADERM 2-3/8X2-3/4 SM (GAUZE/BANDAGES/DRESSINGS) ×4 IMPLANT
DRSG TEGADERM 4X4.75 (GAUZE/BANDAGES/DRESSINGS) ×3 IMPLANT
ELECT REM PT RETURN 9FT ADLT (ELECTROSURGICAL) ×3
ELECTRODE REM PT RTRN 9FT ADLT (ELECTROSURGICAL) ×1 IMPLANT
ENDOLOOP SUT PDS II  0 18 (SUTURE)
ENDOLOOP SUT PDS II 0 18 (SUTURE) IMPLANT
FILTER SMOKE EVAC LAPAROSHD (FILTER) ×3 IMPLANT
GAUZE SPONGE 2X2 8PLY STRL LF (GAUZE/BANDAGES/DRESSINGS) ×1 IMPLANT
GLOVE BIO SURGEON STRL SZ 6.5 (GLOVE) ×2 IMPLANT
GLOVE BIO SURGEON STRL SZ7 (GLOVE) ×3 IMPLANT
GLOVE BIO SURGEONS STRL SZ 6.5 (GLOVE) ×2
GLOVE BIOGEL PI IND STRL 7.5 (GLOVE) ×1 IMPLANT
GLOVE BIOGEL PI INDICATOR 7.5 (GLOVE) ×2
GOWN STRL REUS W/ TWL LRG LVL3 (GOWN DISPOSABLE) ×3 IMPLANT
GOWN STRL REUS W/TWL LRG LVL3 (GOWN DISPOSABLE) ×9
KIT BASIN OR (CUSTOM PROCEDURE TRAY) ×3 IMPLANT
KIT ROOM TURNOVER OR (KITS) ×3 IMPLANT
LIQUID BAND (GAUZE/BANDAGES/DRESSINGS) ×2 IMPLANT
NS IRRIG 1000ML POUR BTL (IV SOLUTION) ×3 IMPLANT
PAD ARMBOARD 7.5X6 YLW CONV (MISCELLANEOUS) ×6 IMPLANT
POUCH SPECIMEN RETRIEVAL 10MM (ENDOMECHANICALS) ×3 IMPLANT
RELOAD STAPLE 45 3.5 BLU ETS (ENDOMECHANICALS) ×1 IMPLANT
RELOAD STAPLE TA45 3.5 REG BLU (ENDOMECHANICALS) ×3 IMPLANT
SCALPEL HARMONIC ACE (MISCELLANEOUS) ×3 IMPLANT
SET IRRIG TUBING LAPAROSCOPIC (IRRIGATION / IRRIGATOR) ×3 IMPLANT
SPECIMEN JAR SMALL (MISCELLANEOUS) ×3 IMPLANT
SPONGE GAUZE 2X2 STER 10/PKG (GAUZE/BANDAGES/DRESSINGS) ×2
STRIP CLOSURE SKIN 1/2X4 (GAUZE/BANDAGES/DRESSINGS) ×2 IMPLANT
SUT MNCRL AB 4-0 PS2 18 (SUTURE) ×3 IMPLANT
TOWEL OR 17X24 6PK STRL BLUE (TOWEL DISPOSABLE) ×3 IMPLANT
TOWEL OR 17X26 10 PK STRL BLUE (TOWEL DISPOSABLE) ×3 IMPLANT
TRAY LAPAROSCOPIC MC (CUSTOM PROCEDURE TRAY) ×3 IMPLANT
TROCAR XCEL BLADELESS 5X75MML (TROCAR) ×6 IMPLANT
TROCAR XCEL BLUNT TIP 100MML (ENDOMECHANICALS) ×3 IMPLANT
TUBING INSUFFLATION (TUBING) ×3 IMPLANT

## 2015-09-20 NOTE — ED Notes (Signed)
Patient transported to CT scan . 

## 2015-09-20 NOTE — Discharge Summary (Signed)
Physician Discharge Summary  Patient ID: Jacob Sheppard MRN: [id number] DOB/AGE: 51/10/1965 51 y.o.  Admit date: 09/20/2015 Discharge date: 09/20/2015  Admission Diagnoses:Acute appendicitis  Discharge Diagnoses: same Active Problems:   Acute appendicitis   Discharged Condition: good  Hospital Course: Acute appendicitis - laparoscopic appendectomy 09/20/15.  Patient has been ambulating, tolerating regular diet, minimal pain controlled with PO pain meds.  Ready for discharge  Consults: None  Significant Diagnostic Studies: radiology: CT scan: see chart  Treatments: surgery: laparoscopic appendectomy  Discharge Exam: Blood pressure 122/76, pulse 88, temperature 98.2 F (36.8 C), temperature source Oral, resp. rate 18, height 5\' 9"  (1.753 m), weight 93.441 kg (206 lb), SpO2 98 %. Abdomen - soft; minimal tenderness; dressings clean/ intact; minimal drainage at umbilical dressing  Disposition: 01-Home or Self Care  Discharge Instructions    Call MD for:  persistant nausea and vomiting    Complete by:  As directed      Call MD for:  redness, tenderness, or signs of infection (pain, swelling, redness, odor or green/yellow discharge around incision site)    Complete by:  As directed      Call MD for:  severe uncontrolled pain    Complete by:  As directed      Call MD for:  temperature >100.4    Complete by:  As directed      Diet general    Complete by:  As directed      Driving Restrictions    Complete by:  As directed   Do not drive while taking pain medications     Increase activity slowly    Complete by:  As directed      May shower / Bathe    Complete by:  As directed      May walk up steps    Complete by:  As directed             Medication List    STOP taking these medications        ondansetron 4 MG tablet  Commonly known as:  ZOFRAN     oxyCODONE 5 MG immediate release tablet  Commonly known as:  Oxy IR/ROXICODONE      TAKE these medications        ibuprofen 200 MG tablet  Commonly known as:  ADVIL,MOTRIN  Take 600 mg by mouth every 8 (eight) hours as needed for moderate pain.     MULTIVITAMIN ADULT PO  Take 1 packet by mouth daily.     oxyCODONE-acetaminophen 5-325 MG tablet  Commonly known as:  PERCOCET/ROXICET  Take 1-2 tablets by mouth every 4 (four) hours as needed for moderate pain.           Follow-up Information    Follow up with Wynona LunaSUEI,Jacob Breshears K., MD. Schedule an appointment as soon as possible for a visit in 2 weeks.   Specialty:  General Surgery   Contact information:   [address] [place] [address] [phone]       Signed: Wynona LunaSUEI,Ethelyn Cerniglia K. 09/20/2015, 5:19 PM

## 2015-09-20 NOTE — Anesthesia Postprocedure Evaluation (Signed)
Anesthesia Post Note  Patient: Jacob Sheppard  Procedure(s) Performed: Procedure(s) (LRB): APPENDECTOMY LAPAROSCOPIC (N/A)  Patient location during evaluation: PACU Anesthesia Type: General Level of consciousness: awake and awake and alert Pain management: pain level controlled Vital Signs Assessment: post-procedure vital signs reviewed and stable Respiratory status: spontaneous breathing and patient connected to nasal cannula oxygen Anesthetic complications: no    Last Vitals:  Filed Vitals:   09/20/15 1140 09/20/15 1428  BP:  122/76  Pulse:  88  Temp: 36.4 C 36.8 C  Resp:  18    Last Pain:  Filed Vitals:   09/20/15 1606  PainSc: 7                  Zyanya Glaza COKER

## 2015-09-20 NOTE — Op Note (Signed)
Appendectomy, Lap, Procedure Note  Indications: The patient presented with a history of right-sided abdominal pain. A CT scan revealed findings consistent with acute appendicitis.  Pre-operative Diagnosis: Acute appendicitis without mention of peritonitis  Post-operative Diagnosis: Same  Surgeon: Giann Obara K.   Assistants: none  Anesthesia: General endotracheal anesthesia  ASA Class: 1  Procedure Details  The patient was seen again in the Holding Room. The risks, benefits, complications, treatment options, and expected outcomes were discussed with the patient and/or family. The possibilities of reaction to medication, perforation of viscus, bleeding, recurrent infection, finding a normal appendix, the need for additional procedures, failure to diagnose a condition, and creating a complication requiring transfusion or operation were discussed. There was concurrence with the proposed plan and informed consent was obtained. The site of surgery was properly noted. The patient was taken to Operating Room, identified as Earnstine RegalRodney Tout and the procedure verified as Appendectomy. A Time Out was held and the above information confirmed.  The patient was placed in the supine position and general anesthesia was induced.  The abdomen was prepped and draped in a sterile fashion. A one centimeter infraumbilical incision was made.  Dissection was carried down to the fascia bluntly.  The fascia was incised vertically.  We entered the peritoneal cavity bluntly.  A pursestring suture was passed around the incision with a 0 Vicryl.  The Hasson cannula was introduced into the abdomen and the tails of the suture were used to hold the Hasson in place.   The pneumoperitoneum was then established maintaining a maximum pressure of 15 mmHg.  Additional 5 mm cannulas then placed in the left lower quadrant of the abdomen and the right upper quadrant under direct visualization. A careful evaluation of the entire abdomen  was carried out. The patient was placed in Trendelenburg and left lateral decubitus position.  The scope was moved to the right upper quadrant port site. The cecum was mobilized medially.  The appendix was identified.  It was densely adherent to the lateral abdominal wall behind the cecum.  It was inflamed but no sign of perforation.  The appendix was carefully dissected. The appendix was was skeletonized with the harmonic scalpel.   The appendix was divided at its base using an endo-GIA stapler. Minimal appendiceal stump was left in place. There was no evidence of bleeding, leakage, or complication after division of the appendix. Irrigation was also performed and irrigate suctioned from the abdomen as well.  The umbilical port site was closed with the purse string suture. There was no residual palpable fascial defect.  The trocar site skin wounds were closed with 4-0 Monocryl.  Instrument, sponge, and needle counts were correct at the conclusion of the case.   Findings: The appendix was found to be inflamed. There were not signs of necrosis.  There was not perforation. There was not abscess formation.  Estimated Blood Loss:  Minimal         Drains: none         Specimens: appendix         Complications:  None; patient tolerated the procedure well.         Disposition: PACU - hemodynamically stable.         Condition: stable   Wilmon ArmsMatthew K. Corliss Skainssuei, MD, [facility]  General/ Trauma Surgery  09/20/2015 10:45 AM

## 2015-09-20 NOTE — ED Provider Notes (Signed)
CSN: [id number]     Arrival date & time 09/20/15  0425 History   First MD Initiated Contact with Patient 09/20/15 [phone]     Chief Complaint  Patient presents with  . Abdominal Pain      HPI Patient presents with abdominal pain over the past 12 hours.  He localizes it to his umbilicus.  He became nauseated and began to vomit.  His pain continued to worsen and thus he came to the ER for evaluation.  He has a prior history of cholecystectomy 3 months ago.  He states the pain feels slightly similar to that.  He denies urinary symptoms.  No fevers or chills.  He's never had discomfort or pain like this before except for when he has cholecystectomy.  Pain is moderate in severity at this time.  Past Medical History  Diagnosis Date  . Anxiety    Past Surgical History  Procedure Laterality Date  . Torn meniscus    . Cholecystectomy N/A 04/04/2015    Procedure: LAPAROSCOPIC CHOLECYSTECTOMY WITH INTRAOPERATIVE CHOLANGIOGRAM;  Surgeon: Violeta GelinasBurke Thompson, MD;  [facility];  Service: General;  Laterality: N/A;   Family History  Problem Relation Age of Onset  . Family history unknown: Yes   Social History  Substance Use Topics  . Smoking status: Current Every Day Smoker -- 0.00 packs/day    Types: Cigarettes  . Smokeless tobacco: None  . Alcohol Use: Yes     Comment: social    Review of Systems  All other systems reviewed and are negative.     Allergies  Review of patient's allergies indicates no known allergies.  Home Medications   Prior to Admission medications   Medication Sig Start Date End Date Taking? Authorizing Provider  ibuprofen (ADVIL,MOTRIN) 200 MG tablet Take 600 mg by mouth every 8 (eight) hours as needed for moderate pain.   Yes Historical Provider, MD  Multiple Vitamins-Minerals (MULTIVITAMIN ADULT PO) Take 1 packet by mouth daily.   Yes Historical Provider, MD  ondansetron (ZOFRAN) 4 MG tablet Take 1 tablet (4 mg total) by mouth every 6 (six) hours as needed for  nausea or vomiting. Patient not taking: Reported on 09/20/2015 03/20/15   Toy CookeyMegan Docherty, MD  oxyCODONE (OXY IR/ROXICODONE) 5 MG immediate release tablet Take 1-2 tablets (5-10 mg total) by mouth every 4 (four) hours as needed (pain). Patient not taking: Reported on 09/20/2015 04/05/15   Glenna FellowsBenjamin Hoxworth, MD   BP 135/87 mmHg  Pulse 85  Temp(Src) 98.6 F (37 C)  Resp 18  Ht 5\' 9"  (1.753 m)  Wt 206 lb (93.441 kg)  BMI 30.41 kg/m2  SpO2 98% Physical Exam  Constitutional: He is oriented to person, place, and time. He appears well-developed and well-nourished.  HENT:  Head: Normocephalic and atraumatic.  Eyes: EOM are normal.  Neck: Normal range of motion.  Cardiovascular: Normal rate, regular rhythm, normal heart sounds and intact distal pulses.   Pulmonary/Chest: Effort normal and breath sounds normal. No respiratory distress.  Abdominal: Soft. He exhibits no distension.  Right-sided abdominal tenderness without guarding or rebound  Musculoskeletal: Normal range of motion.  Neurological: He is alert and oriented to person, place, and time.  Skin: Skin is warm and dry.  Psychiatric: He has a normal mood and affect. Judgment normal.  Nursing note and vitals reviewed.   ED Course  Procedures (including critical care time) Labs Review Labs Reviewed  COMPREHENSIVE METABOLIC PANEL - Abnormal; Notable for the following:    Glucose, Bld 130 (*)  All other components within normal limits  CBC - Abnormal; Notable for the following:    WBC 14.1 (*)    All other components within normal limits  URINALYSIS, ROUTINE W REFLEX MICROSCOPIC (NOT AT [facility]) - Abnormal; Notable for the following:    Color, Urine AMBER (*)    Bilirubin Urine SMALL (*)    Ketones, ur 15 (*)    All other components within normal limits  LIPASE, BLOOD    Imaging Review Ct Abdomen Pelvis W Contrast  09/20/2015  CLINICAL DATA:  Right upper quadrant pain starting around 8 o'clock last night, with some nausea and  vomiting. EXAM: CT ABDOMEN AND PELVIS WITH CONTRAST TECHNIQUE: Multidetector CT imaging of the abdomen and pelvis was performed using the standard protocol following bolus administration of intravenous contrast. CONTRAST:  OMNIPAQUE IOHEXOL 300 MG/ML  SOLN COMPARISON:  None. FINDINGS: Lower chest:  No acute findings. Hepatobiliary: Status post cholecystectomy. Liver appears normal. No bile duct dilatation. Pancreas: No mass, inflammatory changes, or other significant abnormality. Spleen: Within normal limits in size and appearance. Adrenals/Urinary Tract: No masses identified. No evidence of hydronephrosis. Stomach/Bowel: Appendix is distended to approximately 11 mm. Walls of the appendix appear thickened and there is periappendiceal inflammation indicating acute appendicitis. No periappendiceal abscess collection seen. No free intraperitoneal air. No appendicolith seen. Large bowel is normal in caliber. Small bowel is normal in caliber. Stomach appears normal. Vascular/Lymphatic: No pathologically enlarged lymph nodes. No evidence of abdominal aortic aneurysm. Reproductive: No mass or other significant abnormality. Other: None. Musculoskeletal: Tiny periumbilical abdominal wall hernia which contains fat only. Superficial soft tissues otherwise unremarkable. Degenerative changes present within the mid and lower lumbar spine, at least moderate in degree within the lower lumbar spine with associated fairly prominent disc bulges at the L4-5 and L5-S1 levels. IMPRESSION: 1. Acute appendicitis. Appendix distended to approximately 11 mm. Associated periappendiceal inflammation. NO periappendiceal abscess collection seen. NO free intraperitoneal air. 2. Degenerative changes of the lumbar spine, at least moderate in degree within the lower lumbar spine with associated fairly prominent disc bulges at the L4-5 and L5-S1 levels. Suspect some degree of associated nerve root impingement at these levels, most likely at  L5-S1. If any radiculopathic symptoms, would consider further characterization with nonemergent lumbar spine MRI. 3. Tiny periumbilical abdominal wall hernia which contains fat only. No bowel involvement or inflammatory change at this small hernia site. The findings of acute appendicitis were called by telephone at the time of interpretation on 09/20/2015 at 7:32 am to Dr. Azalia Bilis , who verbally acknowledged these results. Electronically Signed   By: Bary Richard M.D.   On: 09/20/2015 07:33   I have personally reviewed and evaluated these images and lab results as part of my medical decision-making.   EKG Interpretation None      MDM   Final diagnoses:  Acute appendicitis, unspecified acute appendicitis type   Acute appendicitis without complicating factors.  Will contact general surgery    Azalia Bilis, MD 09/20/15 [phone]

## 2015-09-20 NOTE — H&P (Signed)
Chief Complaint: abdominal pain HPI: Jacob Sheppard is a 49 year old male known to [place] from a admission for cholecystectomy in June of 2016.  He presents today with sudden onset periumbilical abdominal pain which started about 8PM last night.  He works nights, ate some ribs at around midnight and drove back from [place].  He began vomiting at about 2AM.  He had 3 episodes of emesis.  Pain was severe, 8/10.  Did not localize to the RLQ.  Radiates toward the epigastrium.  Associated with chills, anorexia.  Denies fevers.  Denies diarrhea.  Work up shows WBC 14k.  CT of A/P shows Acute appendicitis. Appendix distended to approximately 11 mm.  Associated periappendiceal inflammation.  We have therefore been asked to evaluate.   Past Medical History  Diagnosis Date  . Anxiety     Past Surgical History  Procedure Laterality Date  . Torn meniscus    . Cholecystectomy N/A 04/04/2015    Procedure: LAPAROSCOPIC CHOLECYSTECTOMY WITH INTRAOPERATIVE CHOLANGIOGRAM;  Surgeon: Georganna Skeans, MD;  Location: [facility] OR;  Service: General;  Laterality: N/A;    Family History  Problem Relation Age of Onset  . Family history unknown: Yes   Social History:  reports that he has been smoking Cigarettes.  He has been smoking about 0.00 packs per day. He does not have any smokeless tobacco history on file. He reports that he drinks alcohol. He reports that he does not use illicit drugs.  Allergies: No Known Allergies   (Not in a hospital admission)  Results for orders placed or performed during the hospital encounter of 09/20/15 (from the past 48 hour(s))  Urinalysis, Routine w reflex microscopic (not at [facility])     Status: Abnormal   Collection Time: 09/20/15  4:35 AM  Result Value Ref Range   Color, Urine AMBER (A) YELLOW    Comment: BIOCHEMICALS MAY BE AFFECTED BY COLOR   APPearance CLEAR CLEAR   Specific Gravity, Urine 1.029 1.005 - 1.030   pH 5.0 5.0 - 8.0   Glucose, UA NEGATIVE NEGATIVE mg/dL   Hgb  urine dipstick NEGATIVE NEGATIVE   Bilirubin Urine SMALL (A) NEGATIVE   Ketones, ur 15 (A) NEGATIVE mg/dL   Protein, ur NEGATIVE NEGATIVE mg/dL   Nitrite NEGATIVE NEGATIVE   Leukocytes, UA NEGATIVE NEGATIVE    Comment: MICROSCOPIC NOT DONE ON URINES WITH NEGATIVE PROTEIN, BLOOD, LEUKOCYTES, NITRITE, OR GLUCOSE <1000 mg/dL.  Lipase, blood     Status: None   Collection Time: 09/20/15  4:56 AM  Result Value Ref Range   Lipase 29 11 - 51 U/L  Comprehensive metabolic panel     Status: Abnormal   Collection Time: 09/20/15  4:56 AM  Result Value Ref Range   Sodium 137 135 - 145 mmol/L   Potassium 3.8 3.5 - 5.1 mmol/L   Chloride 102 101 - 111 mmol/L   CO2 24 22 - 32 mmol/L   Glucose, Bld 130 (H) 65 - 99 mg/dL   BUN 15 6 - 20 mg/dL   Creatinine, Ser 1.15 0.61 - 1.24 mg/dL   Calcium 9.7 8.9 - 10.3 mg/dL   Total Protein 6.9 6.5 - 8.1 g/dL   Albumin 4.1 3.5 - 5.0 g/dL   AST 26 15 - 41 U/L   ALT 28 17 - 63 U/L   Alkaline Phosphatase 64 38 - 126 U/L   Total Bilirubin 0.8 0.3 - 1.2 mg/dL   GFR calc non Af Amer >60 >60 mL/min   GFR calc Af  Amer >60 >60 mL/min    Comment: (NOTE) The eGFR has been calculated using the CKD EPI equation. This calculation has not been validated in all clinical situations. eGFR's persistently <60 mL/min signify possible Chronic Kidney Disease.    Anion gap 11 5 - 15  CBC     Status: Abnormal   Collection Time: 09/20/15  4:56 AM  Result Value Ref Range   WBC 14.1 (H) 4.0 - 10.5 K/uL   RBC 4.70 4.22 - 5.81 MIL/uL   Hemoglobin 15.0 13.0 - 17.0 g/dL   HCT 43.8 39.0 - 52.0 %   MCV 93.2 78.0 - 100.0 fL   MCH 31.9 26.0 - 34.0 pg   MCHC 34.2 30.0 - 36.0 g/dL   RDW 13.3 11.5 - 15.5 %   Platelets 247 150 - 400 K/uL   Ct Abdomen Pelvis W Contrast  09/20/2015  CLINICAL DATA:  Right upper quadrant pain starting around 8 o'clock last night, with some nausea and vomiting. EXAM: CT ABDOMEN AND PELVIS WITH CONTRAST TECHNIQUE: Multidetector CT imaging of the abdomen and  pelvis was performed using the standard protocol following bolus administration of intravenous contrast. CONTRAST:  112mL OMNIPAQUE IOHEXOL 300 MG/ML  SOLN COMPARISON:  None. FINDINGS: Lower chest:  No acute findings. Hepatobiliary: Status post cholecystectomy. Liver appears normal. No bile duct dilatation. Pancreas: No mass, inflammatory changes, or other significant abnormality. Spleen: Within normal limits in size and appearance. Adrenals/Urinary Tract: No masses identified. No evidence of hydronephrosis. Stomach/Bowel: Appendix is distended to approximately 11 mm. Walls of the appendix appear thickened and there is periappendiceal inflammation indicating acute appendicitis. No periappendiceal abscess collection seen. No free intraperitoneal air. No appendicolith seen. Large bowel is normal in caliber. Small bowel is normal in caliber. Stomach appears normal. Vascular/Lymphatic: No pathologically enlarged lymph nodes. No evidence of abdominal aortic aneurysm. Reproductive: No mass or other significant abnormality. Other: None. Musculoskeletal: Tiny periumbilical abdominal wall hernia which contains fat only. Superficial soft tissues otherwise unremarkable. Degenerative changes present within the mid and lower lumbar spine, at least moderate in degree within the lower lumbar spine with associated fairly prominent disc bulges at the L4-5 and L5-S1 levels. IMPRESSION: 1. Acute appendicitis. Appendix distended to approximately 11 mm. Associated periappendiceal inflammation. NO periappendiceal abscess collection seen. NO free intraperitoneal air. 2. Degenerative changes of the lumbar spine, at least moderate in degree within the lower lumbar spine with associated fairly prominent disc bulges at the L4-5 and L5-S1 levels. Suspect some degree of associated nerve root impingement at these levels, most likely at L5-S1. If any radiculopathic symptoms, would consider further characterization with nonemergent lumbar spine  MRI. 3. Tiny periumbilical abdominal wall hernia which contains fat only. No bowel involvement or inflammatory change at this small hernia site. The findings of acute appendicitis were called by telephone at the time of interpretation on 09/20/2015 at 7:32 am to Dr. Jola Schmidt , who verbally acknowledged these results. Electronically Signed   By: Franki Cabot M.D.   On: 09/20/2015 07:33    Review of Systems  Constitutional: Positive for chills. Negative for fever, weight loss, malaise/fatigue and diaphoresis.  Eyes: Negative for blurred vision, double vision, photophobia, pain, discharge and redness.  Respiratory: Negative for cough, hemoptysis, sputum production, shortness of breath and wheezing.   Cardiovascular: Negative for chest pain, palpitations, orthopnea, claudication, leg swelling and PND.  Gastrointestinal: Positive for heartburn, nausea, vomiting and abdominal pain. Negative for diarrhea, constipation, blood in stool and melena.  Genitourinary: Negative for dysuria, urgency, frequency,  hematuria and flank pain.  Musculoskeletal: Negative for myalgias, back pain, joint pain, falls and neck pain.  Neurological: Negative.  Negative for dizziness, tingling, tremors, sensory change, speech change, focal weakness, seizures, loss of consciousness, weakness and headaches.  Psychiatric/Behavioral: Negative for depression and substance abuse. The patient is not nervous/anxious.     Blood pressure 135/87, pulse 85, temperature 98.6 F (37 C), resp. rate 18, height $RemoveBe'5\' 9"'qtltKaWxB$  (1.753 m), weight 93.441 kg (206 lb), SpO2 98 %. Physical Exam  Vitals reviewed. Constitutional: He is oriented to person, place, and time. He appears well-developed and well-nourished. No distress.  Cardiovascular: Normal rate, regular rhythm, normal heart sounds and intact distal pulses.   Respiratory: Effort normal and breath sounds normal. No respiratory distress. He has no wheezes. He exhibits no tenderness.  GI: Soft.  Bowel sounds are normal. He exhibits no distension and no mass. There is no rebound and no guarding.  Focal tenderness to RLQ Abrasion to RUQ  Musculoskeletal: Normal range of motion. He exhibits no edema or tenderness.  Neurological: He is alert and oriented to person, place, and time.  Skin: Skin is warm and dry. No rash noted. He is not diaphoretic. No erythema. No pallor.  Psychiatric: He has a normal mood and affect. His behavior is normal. Judgment and thought content normal.     Assessment/Plan Acute appendicitis-proceed with laparoscopic appendectomy.  Surgical risks discussed including infection, bleeding, injury to surrounding structures, heart attack, blood clots.  The patient verbalizes understanding and wishes to proceed.  Will obtain consent, keep him NPO, add IVF and pain control. VTE prophylaxis-[place], post op lovenox ID-rocephin/flagyl Dispo-to OR  Khadeejah Castner ANP-BC 09/20/2015, 8:05 AM

## 2015-09-20 NOTE — Anesthesia Preprocedure Evaluation (Addendum)
Anesthesia Evaluation  Patient identified by MRN, date of birth, ID band Patient awake    Reviewed: Allergy & Precautions, H&P , NPO status , Patient's Chart, lab work & pertinent test results  Airway Mallampati: II  TM Distance: >3 FB Neck ROM: full    Dental  (+) Teeth Intact, Dental Advidsory Given   Pulmonary Current Smoker,    breath sounds clear to auscultation       Cardiovascular  Rhythm:Regular Rate:Normal     Neuro/Psych    GI/Hepatic   Endo/Other    Renal/GU      Musculoskeletal   Abdominal   Peds  Hematology   Anesthesia Other Findings   Reproductive/Obstetrics                            Anesthesia Physical Anesthesia Plan  ASA: II and emergent  Anesthesia Plan: General   Post-op Pain Management:    Induction: Intravenous and Rapid sequence  Airway Management Planned: Oral ETT  Additional Equipment:   Intra-op Plan:   Post-operative Plan: Extubation in OR  Informed Consent: I have reviewed the patients History and Physical, chart, labs and discussed the procedure including the risks, benefits and alternatives for the proposed anesthesia with the patient or authorized representative who has indicated his/her understanding and acceptance.   Dental Advisory Given  Plan Discussed with: Anesthesiologist, CRNA and Surgeon  Anesthesia Plan Comments: (Acute appendicitis Smoker   Plan GA with RSI  Kipp Broodavid Giannamarie Paulus)       Anesthesia Quick Evaluation

## 2015-09-20 NOTE — Anesthesia Procedure Notes (Signed)
Procedure Name: Intubation Date/Time: 09/20/2015 9:35 AM Performed by: Carmela RimaMARTINELLI, Khamiyah Grefe F Pre-anesthesia Checklist: Patient being monitored, Suction available, Emergency Drugs available, Patient identified and Timeout performed Patient Re-evaluated:Patient Re-evaluated prior to inductionOxygen Delivery Method: Circle system utilized Preoxygenation: Pre-oxygenation with 100% oxygen Intubation Type: IV induction Ventilation: Mask ventilation without difficulty Laryngoscope Size: Mac and 3 (Mac 4 would be a better choice) Grade View: Grade II Tube type: Oral Tube size: 7.5 mm Number of attempts: 1 Placement Confirmation: positive ETCO2,  ETT inserted through vocal cords under direct vision and breath sounds checked- equal and bilateral Secured at: 23 cm Tube secured with: Tape Dental Injury: Teeth and Oropharynx as per pre-operative assessment

## 2015-09-20 NOTE — ED Notes (Signed)
abd pain since 2000 after he ate x 2.  Nausea vomiting

## 2015-09-20 NOTE — Discharge Instructions (Signed)
[facility] °LAPAROSCOPIC SURGERY: POST OP INSTRUCTIONS °Always review your discharge instruction sheet given to you by the facility where your surgery was performed. °IF YOU HAVE DISABILITY OR FAMILY LEAVE FORMS, YOU MUST BRING THEM TO THE OFFICE FOR PROCESSING.   °DO NOT GIVE THEM TO YOUR DOCTOR. ° °1. A prescription for pain medication will be given to you upon discharge.  Take your pain medication as prescribed, if needed.  If narcotic pain medicine is not needed, then you may take acetaminophen (Tylenol) or ibuprofen (Advil) as needed. °2. Take your usually prescribed medications unless otherwise directed. °3. If you need a refill on your pain medication, please contact your pharmacy.  They will contact our office to request authorization. Prescriptions will not be filled after 5pm or on week-ends. °4. You should follow a light diet the first few days after arrival home, such as soup and crackers, etc.  Be sure to include lots of fluids daily. °5. Most patients will experience some swelling and bruising in the area of the incisions.  Ice packs will help.  Swelling and bruising can take several days to resolve.  °6. It is common to experience some constipation if taking pain medication after surgery.  Increasing fluid intake and taking a stool softener (such as Colace) will usually help or prevent this problem from occurring.  A mild laxative (Milk of Magnesia or Miralax) should be taken according to package instructions if there are no bowel movements after 48 hours. °7. Unless discharge instructions indicate otherwise, you may remove your bandages 48 hours after surgery, and you may shower at that time.  You will have steri-strips (small skin tapes) in place directly over the incision.  These strips should be left on the skin for 7-10 days.  If your surgeon used skin glue on the incision, you may shower in 24 hours.  The glue will flake off over the next 2-3 weeks.  Any sutures or staples  will be removed at the office during your follow-up visit. °8. ACTIVITIES:  You may resume regular (light) daily activities beginning the next day--such as daily self-care, walking, climbing stairs--gradually increasing activities as tolerated.  You may have sexual intercourse when it is comfortable.  Refrain from any heavy lifting or straining until approved by your doctor. °a. You may drive when you are no longer taking prescription pain medication, you can comfortably wear a seatbelt, and you can safely maneuver your car and apply brakes. °b. RETURN TO WORK:   2-3 weeks °9. You should see your doctor in the office for a follow-up appointment approximately 2-3 weeks after your surgery.  Make sure that you call for this appointment within a day or two after you arrive home to insure a convenient appointment time. °10. OTHER INSTRUCTIONS: ________________________________________________________________________ °WHEN TO CALL YOUR DOCTOR: °1. Fever over 101.0 °2. Inability to urinate °3. Continued bleeding from incision. °4. Increased pain, redness, or drainage from the incision. °5. Increasing abdominal pain ° °The clinic staff is available to answer your questions during regular business hours.  Please don’t hesitate to call and ask to speak to one of the nurses for clinical concerns.  If you have a medical emergency, go to the nearest emergency room or call 911.  A surgeon from [facility] is always on call at the hospital. °[address], [address], [place], [place]  [address] ? [address] [address], [place], [place]   [address] °[phone] ? [phone] ? FAX [phone] °Web site:   [email or web address] ° °

## 2015-09-20 NOTE — Transfer of Care (Signed)
Immediate Anesthesia Transfer of Care Note  Patient: Jacob Sheppard  Procedure(s) Performed: Procedure(s): APPENDECTOMY LAPAROSCOPIC (N/A)  Patient Location: PACU  Anesthesia Type:General  Level of Consciousness: awake, alert  and oriented  Airway & Oxygen Therapy: Patient Spontanous Breathing and Patient connected to nasal cannula oxygen  Post-op Assessment: Report given to RN, Post -op Vital signs reviewed and stable and Patient moving all extremities X 4  Post vital signs: Reviewed and stable  Last Vitals:  Filed Vitals:   09/20/15 0737 09/20/15 0845  BP: 135/87 133/88  Pulse: 85 73  Temp:    Resp: 18 18    Complications: No apparent anesthesia complications

## 2015-09-21 ENCOUNTER — Encounter (HOSPITAL_COMMUNITY): Payer: Self-pay | Admitting: Surgery

## 2015-09-30 ENCOUNTER — Encounter: Payer: Self-pay | Admitting: Gastroenterology

## 2015-10-04 ENCOUNTER — Telehealth: Payer: Self-pay | Admitting: Surgery

## 2015-10-04 NOTE — Telephone Encounter (Signed)
Mr. Jacob Sheppard had a lap appendectomy on 09/21/2015 by Dr. Corliss Skainssuei.  He was doing well and was seen by Dr. Corliss Skainssuei last week.  But he was working on his washing machine and felt a pull in his umbilical incision and it now hurts lifting.  There is no redness or drainage.  I told him to call the office Monday, 12/19, and get another appt with Dr. Corliss Skainssuei to check the incision.  Ovidio Kinavid Ferrin Liebig, MD, [facility] Pager: [phone] Office phone:  [phone]

## 2015-11-23 ENCOUNTER — Ambulatory Visit (AMBULATORY_SURGERY_CENTER): Payer: Self-pay

## 2015-11-23 VITALS — Ht 69.0 in | Wt 208.2 lb

## 2015-11-23 DIAGNOSIS — Z1211 Encounter for screening for malignant neoplasm of colon: Secondary | ICD-10-CM

## 2015-11-23 MED ORDER — SUPREP BOWEL PREP KIT 17.5-3.13-1.6 GM/177ML PO SOLN: 1.0000 | Freq: Once | ORAL | Status: DC

## 2015-11-23 NOTE — Progress Notes (Signed)
No allergies to eggs or soy No past problems with anesthesia No home oxygen No diet/weight loss meds  Has email and internet; registered emmi 

## 2015-11-30 ENCOUNTER — Encounter: Payer: Self-pay | Admitting: Gastroenterology

## 2015-11-30 ENCOUNTER — Ambulatory Visit (AMBULATORY_SURGERY_CENTER): Payer: 59 | Admitting: Gastroenterology

## 2015-11-30 VITALS — BP 128/83 | HR 59 | Temp 98.1°F | Resp 37 | Ht 69.0 in | Wt 208.0 lb

## 2015-11-30 DIAGNOSIS — Z1211 Encounter for screening for malignant neoplasm of colon: Secondary | ICD-10-CM | POA: Diagnosis present

## 2015-11-30 MED ORDER — SODIUM CHLORIDE 0.9 % IV SOLN: 500.0000 mL | INTRAVENOUS | Status: DC

## 2015-11-30 NOTE — Patient Instructions (Signed)
YOU HAD AN ENDOSCOPIC PROCEDURE TODAY AT THE [facility]:   Refer to the procedure report that was given to you for any specific questions about what was found during the examination.  If the procedure report does not answer your questions, please call your gastroenterologist to clarify.  If you requested that your care partner not be given the details of your procedure findings, then the procedure report has been included in a sealed envelope for you to review at your convenience later.  YOU SHOULD EXPECT: Some feelings of bloating in the abdomen. Passage of more gas than usual.  Walking can help get rid of the air that was put into your GI tract during the procedure and reduce the bloating. If you had a lower endoscopy (such as a colonoscopy or flexible sigmoidoscopy) you may notice spotting of blood in your stool or on the toilet paper. If you underwent a bowel prep for your procedure, you may not have a normal bowel movement for a few days.  Please Note:  You might notice some irritation and congestion in your nose or some drainage.  This is from the oxygen used during your procedure.  There is no need for concern and it should clear up in a day or so.  SYMPTOMS TO REPORT IMMEDIATELY:   Following lower endoscopy (colonoscopy or flexible sigmoidoscopy):  Excessive amounts of blood in the stool  Significant tenderness or worsening of abdominal pains  Swelling of the abdomen that is new, acute  Fever of 100F or higher    For urgent or emergent issues, a gastroenterologist can be reached at any hour by calling [phone].   DIET: Your first meal following the procedure should be a small meal and then it is ok to progress to your normal diet. Heavy or fried foods are harder to digest and may make you feel nauseous or bloated.  Likewise, meals heavy in dairy and vegetables can increase bloating.  Drink plenty of fluids but you should avoid alcoholic beverages for 24  hours.  ACTIVITY:  You should plan to take it easy for the rest of today and you should NOT DRIVE or use heavy machinery until tomorrow (because of the sedation medicines used during the test).    FOLLOW UP: Our staff will call the number listed on your records the next business day following your procedure to check on you and address any questions or concerns that you may have regarding the information given to you following your procedure. If we do not reach you, we will leave a message.  However, if you are feeling well and you are not experiencing any problems, there is no need to return our call.  We will assume that you have returned to your regular daily activities without incident.  If any biopsies were taken you will be contacted by phone or by letter within the next 1-3 weeks.  Please call us at [phone] if you have not heard about the biopsies in 3 weeks.    SIGNATURES/CONFIDENTIALITY: You and/or your care partner have signed paperwork which will be entered into your electronic medical record.  These signatures attest to the fact that that the information above on your After Visit Summary has been reviewed and is understood.  Full responsibility of the confidentiality of this discharge information lies with you and/or your care-partner.   INFORMATION ON HEMORRHOIDS GIVEN TO YOU TODAY

## 2015-11-30 NOTE — Op Note (Addendum)
[facility] [address] [place], [address]   COLONOSCOPY PROCEDURE REPORT  PATIENT: Jacob Sheppard, Jacob Sheppard  MR#: [id number] BIRTHDATE: 06/29/1966 , 49  yrs. old GENDER: male ENDOSCOPIST: Meryl Dare, MD, [facility] REFERRED BY:  Jen Mow, NP PROCEDURE DATE:  11/30/2015 PROCEDURE:   Colonoscopy, screening First Screening Colonoscopy - Avg.  risk and is 50 yrs.  old or older Yes.  Prior Negative Screening - Now for repeat screening. N/A  History of Adenoma - Now for follow-up colonoscopy & has been > or = to 3 yrs.  N/A  Polyps removed today? No Recommend repeat exam, <10 yrs? No ASA CLASS:   Class II INDICATIONS:Screening for colonic neoplasia and Colorectal Neoplasm Risk Assessment for this procedure is average risk. MEDICATIONS: Monitored anesthesia care and Propofol 240 mg IV  DESCRIPTION OF PROCEDURE:   After the risks benefits and alternatives of the procedure were thoroughly explained, informed consent was obtained.  The digital rectal exam revealed no abnormalities of the rectum.   The LB [id number]  endoscope was introduced through the anus and advanced to the cecum, which was identified by both the appendix and ileocecal valve. No adverse events experienced.   The quality of the prep was excellent. (Suprep was used)  The instrument was then slowly withdrawn as the colon was fully examined. Estimated blood loss is zero unless otherwise noted in this procedure report.    COLON FINDINGS: A normal appearing cecum, ileocecal valve, and appendiceal orifice were identified.  The ascending, transverse, descending, sigmoid colon, and rectum appeared unremarkable. Retroflexed views revealed internal Grade I hemorrhoids. The time to cecum = 2.0 Withdrawal time = 9.0   The scope was withdrawn and the procedure completed. COMPLICATIONS: There were no immediate complications.  ENDOSCOPIC IMPRESSION: 1.  Normal colonoscopy 2.  Grade I internal  hemorrhoids  RECOMMENDATIONS: Continue current colorectal screening recommendations for "routine risk" patients with a repeat colonoscopy in 10 years.  eSigned:  Meryl Dare, MD, [facility] 11/30/2015 3:53 PM Revised: 11/30/2015 3:53 PM  [C

## 2015-11-30 NOTE — Progress Notes (Signed)
Report to PACU, RN, vss, BBS= Clear.  

## 2015-12-01 ENCOUNTER — Telehealth: Payer: Self-pay

## 2015-12-01 NOTE — Telephone Encounter (Signed)
No answer, left voicemail

## 2015-12-08 ENCOUNTER — Encounter: Payer: 59 | Admitting: Gastroenterology

## 2016-11-17 IMAGING — CT CT ABD-PELV W/ CM
2 of 5 series · 11 of 46 positions shown, 12 images · IV contrast (Iodine)
Comparison: None.

CLINICAL DATA: Right upper quadrant pain starting around 8 o'clock
last night, with some nausea and vomiting.

EXAM:
CT ABDOMEN AND PELVIS WITH CONTRAST
TECHNIQUE: Multidetector CT imaging of the abdomen and pelvis was performed
using the standard protocol following bolus administration of
intravenous contrast.
CONTRAST:  100mL OMNIPAQUE IOHEXOL 300 MG/ML  SOLN

[Series 201: routine, idose (2) · axial · 0.78mm/px · z∈[-667,-347]mm · 8 of 84 slices shown, 9 images]
[im 10/84  soft-tissue]
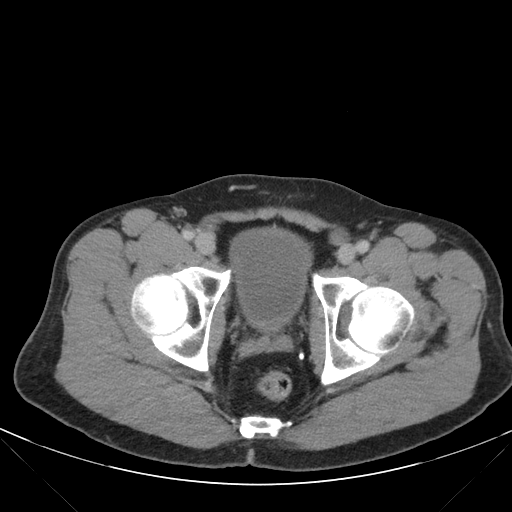
[im 10/84  bone]
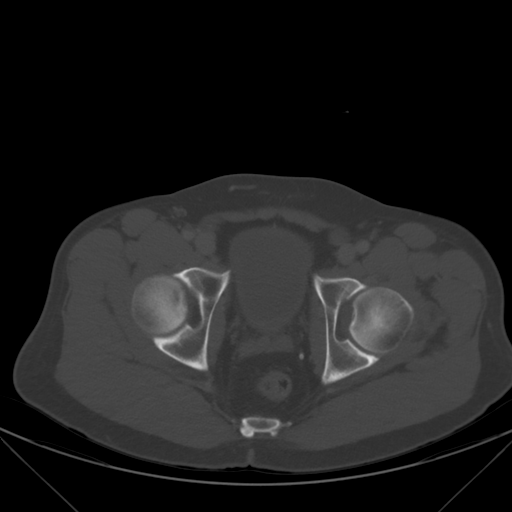
[im 19/84  soft-tissue]
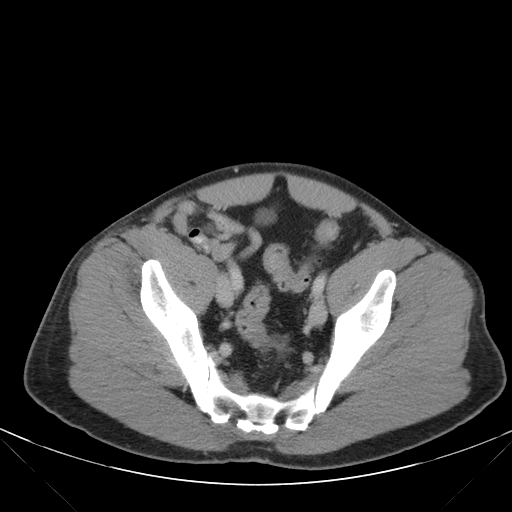
[im 28/84  soft-tissue]
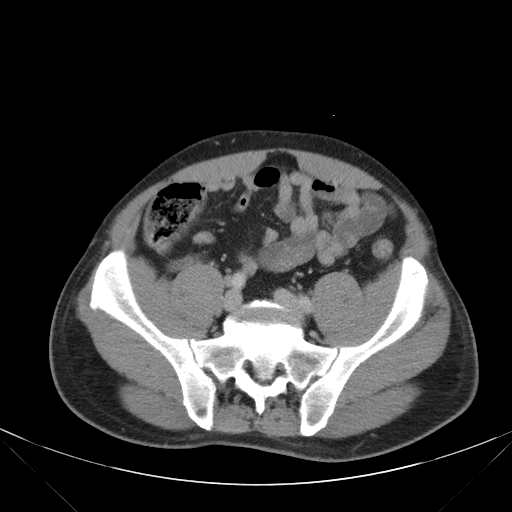
[im 37/84  soft-tissue]
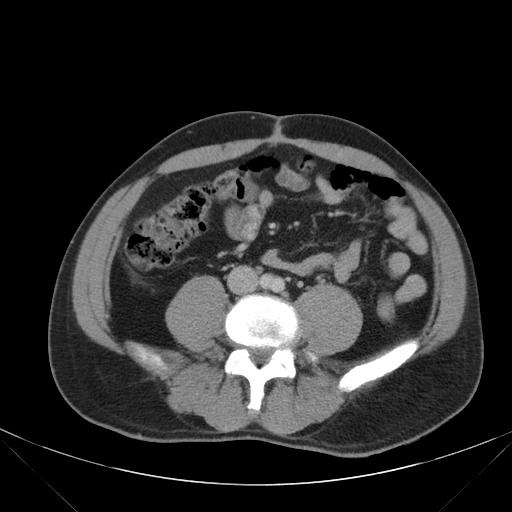
[im 47/84  soft-tissue]
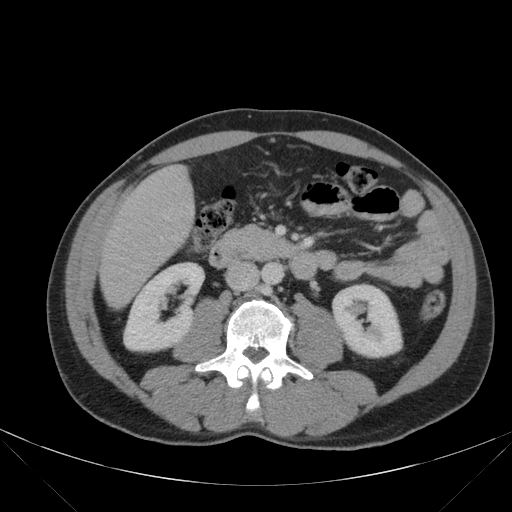
[im 56/84  soft-tissue]
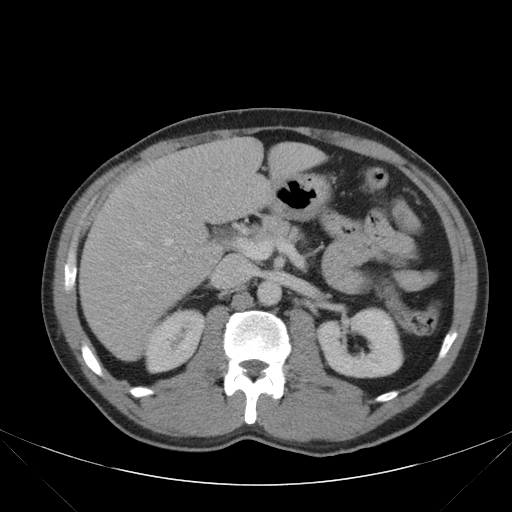
[im 65/84  soft-tissue]
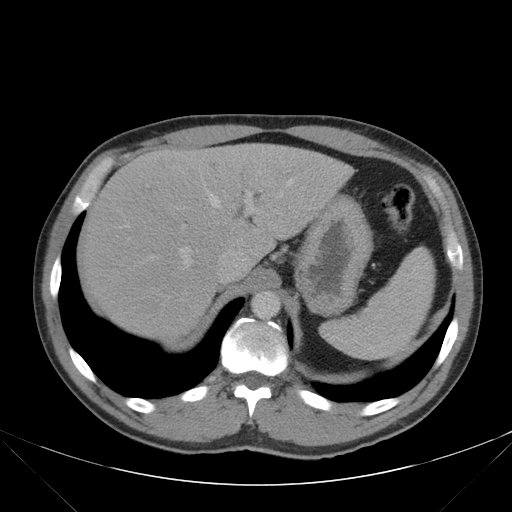
[im 74/84  soft-tissue]
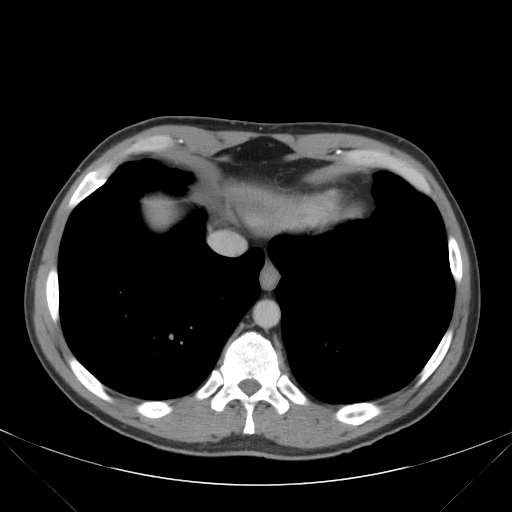

[Series 203: coronals, idose (2) · coronal · 0.45mm/px · 3 of 122 slices shown]
[im 41/122  soft-tissue]
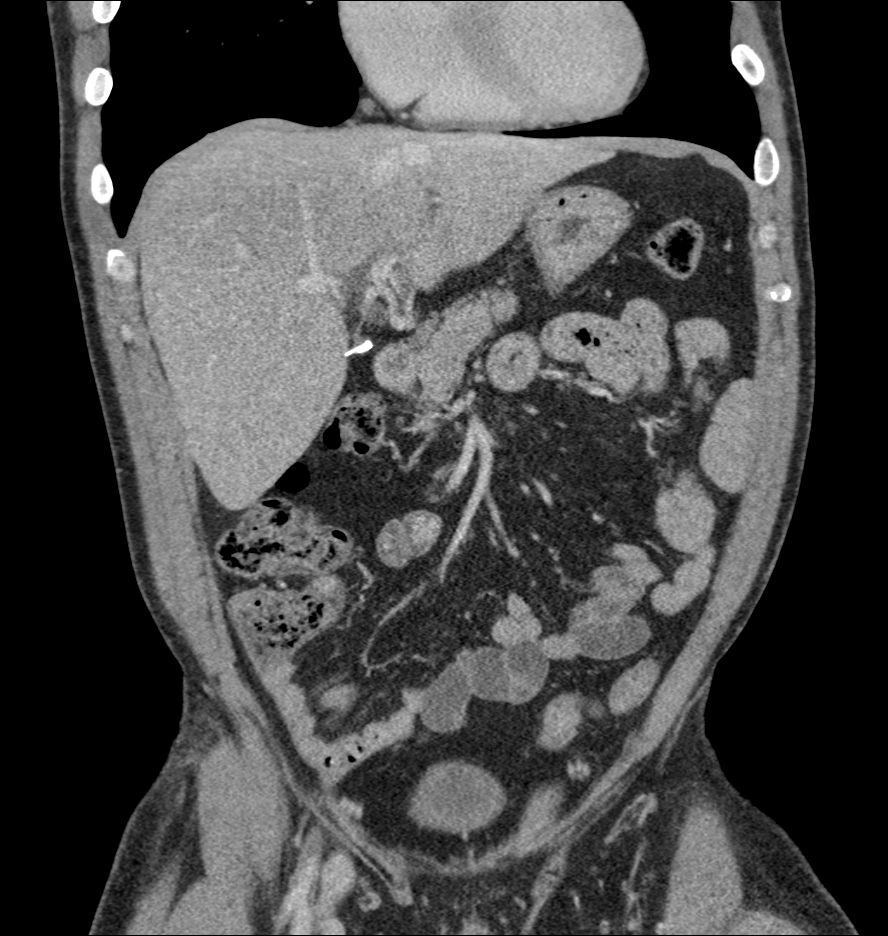
[im 54/122  soft-tissue]
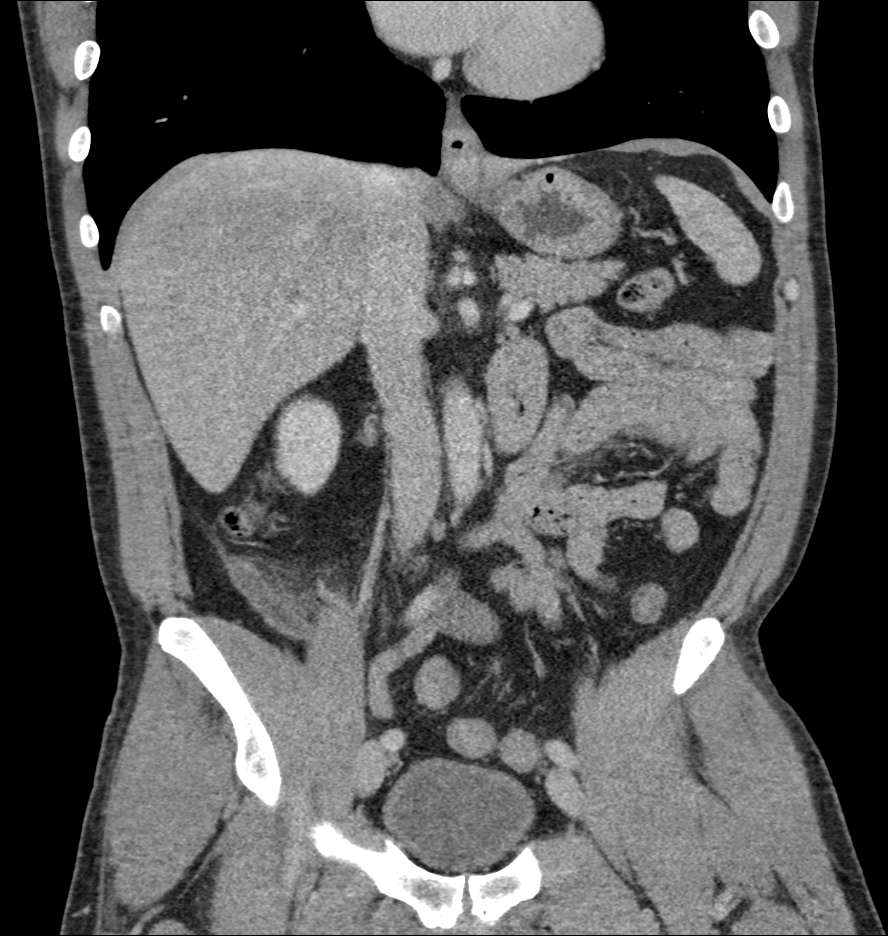
[im 68/122  soft-tissue]
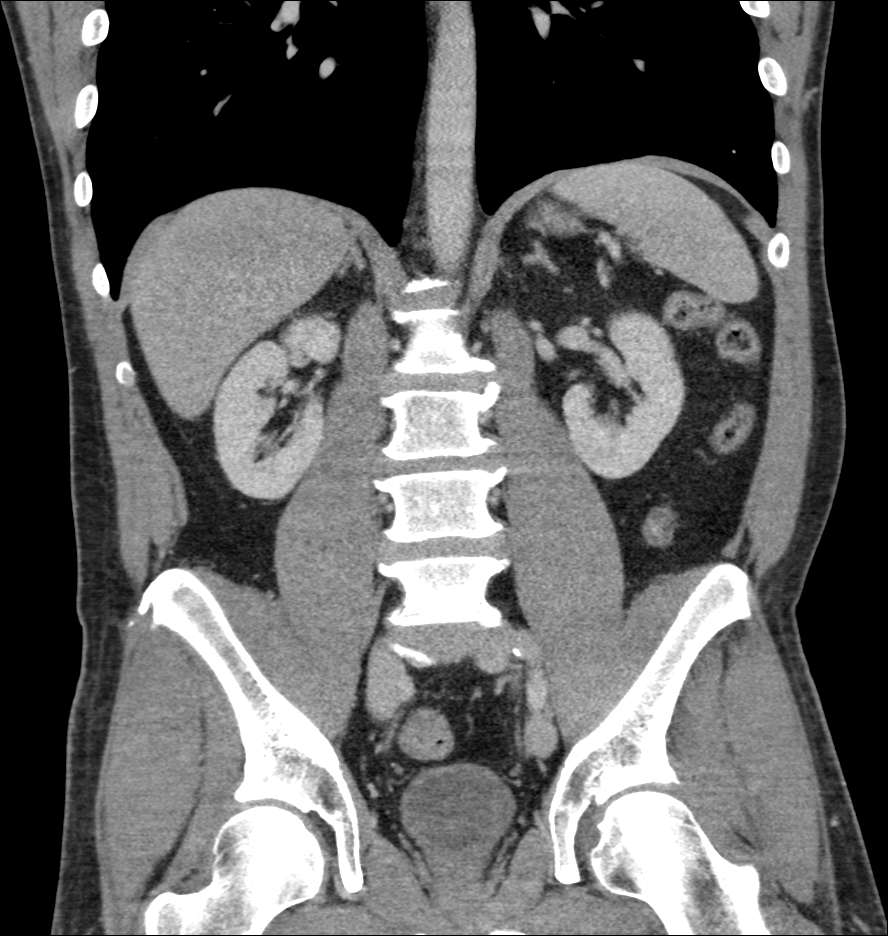

[11 of 46 positions shown; findings below may reference images not displayed]

FINDINGS: Lower chest:  No acute findings.

Hepatobiliary: Status post cholecystectomy. Liver appears normal. No
bile duct dilatation.

Pancreas: No mass, inflammatory changes, or other significant
abnormality.

Spleen: Within normal limits in size and appearance.

Adrenals/Urinary Tract: No masses identified. No evidence of
hydronephrosis.

Stomach/Bowel: Appendix is distended to approximately 11 mm. Walls
of the appendix appear thickened and there is periappendiceal
inflammation indicating acute appendicitis. No periappendiceal
abscess collection seen. No free intraperitoneal air. No
appendicolith seen.

Large bowel is normal in caliber. Small bowel is normal in caliber.
Stomach appears normal.

Vascular/Lymphatic: No pathologically enlarged lymph nodes. No
evidence of abdominal aortic aneurysm.

Reproductive: No mass or other significant abnormality.

Other: None.

Musculoskeletal: Tiny periumbilical abdominal wall hernia which
contains fat only. Superficial soft tissues otherwise unremarkable.
Degenerative changes present within the mid and lower lumbar spine,
at least moderate in degree within the lower lumbar spine with
associated fairly prominent disc bulges at the L4-5 and L5-S1
levels.
IMPRESSION: 1. Acute appendicitis. Appendix distended to approximately 11 mm.
Associated periappendiceal inflammation. NO periappendiceal abscess
collection seen. NO free intraperitoneal air.
2. Degenerative changes of the lumbar spine, at least moderate in
degree within the lower lumbar spine with associated fairly
prominent disc bulges at the L4-5 and L5-S1 levels. Suspect some
degree of associated nerve root impingement at these levels, most
likely at L5-S1. If any radiculopathic symptoms, would consider
further characterization with nonemergent lumbar spine MRI.
3. Tiny periumbilical abdominal wall hernia which contains fat only.
No bowel involvement or inflammatory change at this small hernia
site.

The findings of acute appendicitis were called by telephone at the
time of interpretation on 09/20/2015 at [DATE] to Dr. TRABIS BAAS ,
who verbally acknowledged these results.
